# Patient Record
Sex: Female | Born: 1975 | Race: White | Hispanic: No | Marital: Married | Smoking: Current every day smoker
Health system: Southern US, Community
[De-identification: ages and names within clinical notes are randomized; demographics above are authoritative.]

## PROBLEM LIST (undated history)

## (undated) DIAGNOSIS — F419 Anxiety disorder, unspecified: Secondary | ICD-10-CM

## (undated) DIAGNOSIS — F4310 Post-traumatic stress disorder, unspecified: Secondary | ICD-10-CM

## (undated) DIAGNOSIS — M4317 Spondylolisthesis, lumbosacral region: Secondary | ICD-10-CM

## (undated) DIAGNOSIS — M79606 Pain in leg, unspecified: Secondary | ICD-10-CM

## (undated) DIAGNOSIS — T8859XA Other complications of anesthesia, initial encounter: Secondary | ICD-10-CM

## (undated) DIAGNOSIS — M545 Low back pain, unspecified: Secondary | ICD-10-CM

## (undated) DIAGNOSIS — R519 Headache, unspecified: Secondary | ICD-10-CM

## (undated) DIAGNOSIS — Z9889 Other specified postprocedural states: Secondary | ICD-10-CM

## (undated) DIAGNOSIS — K219 Gastro-esophageal reflux disease without esophagitis: Secondary | ICD-10-CM

## (undated) DIAGNOSIS — R112 Nausea with vomiting, unspecified: Secondary | ICD-10-CM

## (undated) HISTORY — DX: Low back pain, unspecified: M54.50

## (undated) HISTORY — PX: TONSILLECTOMY: SUR1361

## (undated) HISTORY — PX: BREAST LUMPECTOMY: SHX2

## (undated) HISTORY — DX: Spondylolisthesis, lumbosacral region: M43.17

## (undated) HISTORY — PX: OTHER SURGICAL HISTORY: SHX169

## (undated) HISTORY — DX: Pain in leg, unspecified: M79.606

---

## 2006-08-03 DIAGNOSIS — I471 Supraventricular tachycardia, unspecified: Secondary | ICD-10-CM

## 2006-08-03 HISTORY — DX: Supraventricular tachycardia: I47.1

## 2006-08-03 HISTORY — DX: Supraventricular tachycardia, unspecified: I47.10

## 2020-04-26 ENCOUNTER — Other Ambulatory Visit: Payer: Self-pay | Admitting: Orthopedic Surgery

## 2020-05-01 ENCOUNTER — Other Ambulatory Visit: Payer: Self-pay

## 2020-05-13 ENCOUNTER — Other Ambulatory Visit: Payer: Self-pay

## 2020-05-13 ENCOUNTER — Encounter: Payer: Self-pay | Admitting: Surgery

## 2020-05-13 ENCOUNTER — Ambulatory Visit (INDEPENDENT_AMBULATORY_CARE_PROVIDER_SITE_OTHER): Payer: No Typology Code available for payment source | Admitting: Surgery

## 2020-05-13 VITALS — BP 137/85 | HR 92 | Temp 98.2°F | Resp 20 | Ht 62.0 in | Wt 191.0 lb

## 2020-05-13 DIAGNOSIS — M479 Spondylosis, unspecified: Secondary | ICD-10-CM

## 2020-05-13 NOTE — H&P (View-Only) (Signed)
Vascular and Vein Specialist of [facility]  Patient name: Heather Short MRN: [id number] DOB: [birth year] Sex: female   REQUESTING PROVIDER:    Dr. Yevette Edwards   REASON FOR CONSULT:    Low back disease  HISTORY OF PRESENT ILLNESS:   Heather Short is a 44 y.o. female, who is referred for anterior exposure of L5-S1.  The patient suffered a traumatic injury at work.  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  The patient's prior operations include transvaginal tubal ligation and C-section.  She is a smoker  PAST MEDICAL HISTORY    Past Medical History:  Diagnosis Date  . Leg pain    left  . Low back pain   . Spondylolisthesis at L5-S1 level      FAMILY HISTORY   History reviewed. No pertinent family history.  SOCIAL HISTORY:   Social History   Socioeconomic History  . Marital status: Married    Spouse name: Not on file  . Number of children: Not on file  . Years of education: Not on file  . Highest education level: Not on file  Occupational History  . Not on file  Tobacco Use  . Smoking status: Current Every Day Smoker    Packs/day: 0.25    Types: Cigarettes  . Smokeless tobacco: Never Used  Vaping Use  . Vaping Use: Never used  Substance and Sexual Activity  . Alcohol use: Yes    Comment: occasional   . Drug use: Never  . Sexual activity: Not on file  Other Topics Concern  . Not on file  Social History Narrative  . Not on file   Social Determinants of Health   Financial Resource Strain:   . Difficulty of Paying Living Expenses: Not on file  Food Insecurity:   . Worried About Programme researcher, broadcasting/film/video in the Last Year: Not on file  . Ran Out of Food in the Last Year: Not on file  Transportation Needs:   . Lack of Transportation (Medical): Not on file  . Lack of Transportation (Non-Medical): Not on file  Physical Activity:   . Days of Exercise per Week: Not  on file  . Minutes of Exercise per Session: Not on file  Stress:   . Feeling of Stress : Not on file  Social Connections:   . Frequency of Communication with Friends and Family: Not on file  . Frequency of Social Gatherings with Friends and Family: Not on file  . Attends Religious Services: Not on file  . Active Member of Clubs or Organizations: Not on file  . Attends Banker Meetings: Not on file  . Marital Status: Not on file  Intimate Partner Violence:   . Fear of Current or Ex-Partner: Not on file  . Emotionally Abused: Not on file  . Physically Abused: Not on file  . Sexually Abused: Not on file    ALLERGIES:    Allergies  Allergen Reactions  . Eletriptan     Other reaction(s): Other Patient reports her throat swells when she takes relpax  . Topiramate     Other reaction(s): Confusion  . Latex Hives  . Midazolam Hives  . Other Hives    CURRENT MEDICATIONS:    Current Outpatient Medications  Medication Sig Dispense Refill  . amphetamine-dextroamphetamine (ADDERALL) 10 MG tablet Take 10 mg by mouth daily.    Marland Kitchen buPROPion (WELLBUTRIN XL) 300 MG 24 hr  tablet Take 300 mg by mouth every morning.    . cyclobenzaprine (FLEXERIL) 5 MG tablet Take 5-10 mg by mouth at bedtime as needed.    . gabapentin (NEURONTIN) 300 MG capsule Take 300 mg by mouth 3 (three) times daily as needed.    . hydrOXYzine (VISTARIL) 25 MG capsule Take by mouth.    Marland Kitchen imipramine (TOFRANIL) 10 MG tablet Take 30 mg by mouth at bedtime.    . metoCLOPramide (REGLAN) 10 MG tablet Take by mouth.    Marland Kitchen omeprazole (PRILOSEC) 40 MG capsule Take 40 mg by mouth daily.    . ondansetron (ZOFRAN) 4 MG tablet Take by mouth.    Marland Kitchen tiZANidine (ZANAFLEX) 4 MG capsule TAKE ONE CAPSULE BY MOUTH EVERY 8 HOURS    . traZODone (DESYREL) 100 MG tablet Take 100 mg by mouth at bedtime.     No current facility-administered medications for this visit.    REVIEW OF SYSTEMS:   [X]  denotes positive finding, [ ]   denotes negative finding Cardiac  Comments:  Chest pain or chest pressure:    Shortness of breath upon exertion:    Short of breath when lying flat:    Irregular heart rhythm:        Vascular    Pain in calf, thigh, or hip brought on by ambulation:    Pain in feet at night that wakes you up from your sleep:     Blood clot in your veins:    Leg swelling:         Pulmonary    Oxygen at home:    Productive cough:     Wheezing:         Neurologic    Sudden weakness in arms or legs:     Sudden numbness in arms or legs:     Sudden onset of difficulty speaking or slurred speech:    Temporary loss of vision in one eye:     Problems with dizziness:         Gastrointestinal    Blood in stool:      Vomited blood:         Genitourinary    Burning when urinating:     Blood in urine:        Psychiatric    Major depression:         Hematologic    Bleeding problems:    Problems with blood clotting too easily:        Skin    Rashes or ulcers:        Constitutional    Fever or chills:     PHYSICAL EXAM:   Vitals:   05/13/20 1455  BP: 137/85  Pulse: 92  Resp: 20  Temp: 98.2 F (36.8 C)  SpO2: 97%  Weight: 191 lb (86.6 kg)  Height: 5\' 2"  (1.575 m)    GENERAL: The patient is a well-nourished female, in no acute distress. The vital signs are documented above. CARDIAC: There is a regular rate and rhythm.  VASCULAR: Palpable pedal pulses bilaterally PULMONARY: Nonlabored respirations ABDOMEN: Soft and non-tender with normal pitched bowel sounds.  MUSCULOSKELETAL: There are no major deformities or cyanosis. NEUROLOGIC: No focal weakness or paresthesias are detected. SKIN: There are no ulcers or rashes noted. PSYCHIATRIC: The patient has a normal affect.  STUDIES:   I have reviewed her MRI with normal vascular findings  ASSESSMENT and PLAN   Low back pain: I have been asked to provide anterior exposure for the  L5-S1 disc space.  I discussed my role in the procedure  including the type of incision, the risk of injury to the artery nerve and ureter.  The risk of hernia and wound issues.  All the patient's questions were answered.   Charlena Cross, MD, FACS [facility] Specialists of [facility] [phone] Pager [phone]

## 2020-05-13 NOTE — Progress Notes (Signed)
Vascular and Vein Specialist of [facility]  Patient name: Heather Short MRN: [id number] DOB: [birth year] Sex: female   REQUESTING PROVIDER:    Dr. Yevette Edwards   REASON FOR CONSULT:    Low back disease  HISTORY OF PRESENT ILLNESS:   Heather Short is a 44 y.o. female, who is referred for anterior exposure of L5-S1.  The patient suffered a traumatic injury at work.  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  The patient's prior operations include transvaginal tubal ligation and C-section.  She is a smoker  PAST MEDICAL HISTORY    Past Medical History:  Diagnosis Date  . Leg pain    left  . Low back pain   . Spondylolisthesis at L5-S1 level      FAMILY HISTORY   History reviewed. No pertinent family history.  SOCIAL HISTORY:   Social History   Socioeconomic History  . Marital status: Married    Spouse name: Not on file  . Number of children: Not on file  . Years of education: Not on file  . Highest education level: Not on file  Occupational History  . Not on file  Tobacco Use  . Smoking status: Current Every Day Smoker    Packs/day: 0.25    Types: Cigarettes  . Smokeless tobacco: Never Used  Vaping Use  . Vaping Use: Never used  Substance and Sexual Activity  . Alcohol use: Yes    Comment: occasional   . Drug use: Never  . Sexual activity: Not on file  Other Topics Concern  . Not on file  Social History Narrative  . Not on file   Social Determinants of Health   Financial Resource Strain:   . Difficulty of Paying Living Expenses: Not on file  Food Insecurity:   . Worried About Programme researcher, broadcasting/film/video in the Last Year: Not on file  . Ran Out of Food in the Last Year: Not on file  Transportation Needs:   . Lack of Transportation (Medical): Not on file  . Lack of Transportation (Non-Medical): Not on file  Physical Activity:   . Days of Exercise per Week: Not  on file  . Minutes of Exercise per Session: Not on file  Stress:   . Feeling of Stress : Not on file  Social Connections:   . Frequency of Communication with Friends and Family: Not on file  . Frequency of Social Gatherings with Friends and Family: Not on file  . Attends Religious Services: Not on file  . Active Member of Clubs or Organizations: Not on file  . Attends Banker Meetings: Not on file  . Marital Status: Not on file  Intimate Partner Violence:   . Fear of Current or Ex-Partner: Not on file  . Emotionally Abused: Not on file  . Physically Abused: Not on file  . Sexually Abused: Not on file    ALLERGIES:    Allergies  Allergen Reactions  . Eletriptan     Other reaction(s): Other Patient reports her throat swells when she takes relpax  . Topiramate     Other reaction(s): Confusion  . Latex Hives  . Midazolam Hives  . Other Hives    CURRENT MEDICATIONS:    Current Outpatient Medications  Medication Sig Dispense Refill  . amphetamine-dextroamphetamine (ADDERALL) 10 MG tablet Take 10 mg by mouth daily.    Marland Kitchen buPROPion (WELLBUTRIN XL) 300 MG 24 hr  tablet Take 300 mg by mouth every morning.    . cyclobenzaprine (FLEXERIL) 5 MG tablet Take 5-10 mg by mouth at bedtime as needed.    . gabapentin (NEURONTIN) 300 MG capsule Take 300 mg by mouth 3 (three) times daily as needed.    . hydrOXYzine (VISTARIL) 25 MG capsule Take by mouth.    Marland Kitchen imipramine (TOFRANIL) 10 MG tablet Take 30 mg by mouth at bedtime.    . metoCLOPramide (REGLAN) 10 MG tablet Take by mouth.    Marland Kitchen omeprazole (PRILOSEC) 40 MG capsule Take 40 mg by mouth daily.    . ondansetron (ZOFRAN) 4 MG tablet Take by mouth.    Marland Kitchen tiZANidine (ZANAFLEX) 4 MG capsule TAKE ONE CAPSULE BY MOUTH EVERY 8 HOURS    . traZODone (DESYREL) 100 MG tablet Take 100 mg by mouth at bedtime.     No current facility-administered medications for this visit.    REVIEW OF SYSTEMS:   [X]  denotes positive finding, [ ]   denotes negative finding Cardiac  Comments:  Chest pain or chest pressure:    Shortness of breath upon exertion:    Short of breath when lying flat:    Irregular heart rhythm:        Vascular    Pain in calf, thigh, or hip brought on by ambulation:    Pain in feet at night that wakes you up from your sleep:     Blood clot in your veins:    Leg swelling:         Pulmonary    Oxygen at home:    Productive cough:     Wheezing:         Neurologic    Sudden weakness in arms or legs:     Sudden numbness in arms or legs:     Sudden onset of difficulty speaking or slurred speech:    Temporary loss of vision in one eye:     Problems with dizziness:         Gastrointestinal    Blood in stool:      Vomited blood:         Genitourinary    Burning when urinating:     Blood in urine:        Psychiatric    Major depression:         Hematologic    Bleeding problems:    Problems with blood clotting too easily:        Skin    Rashes or ulcers:        Constitutional    Fever or chills:     PHYSICAL EXAM:   Vitals:   05/13/20 1455  BP: 137/85  Pulse: 92  Resp: 20  Temp: 98.2 F (36.8 C)  SpO2: 97%  Weight: 191 lb (86.6 kg)  Height: 5\' 2"  (1.575 m)    GENERAL: The patient is a well-nourished female, in no acute distress. The vital signs are documented above. CARDIAC: There is a regular rate and rhythm.  VASCULAR: Palpable pedal pulses bilaterally PULMONARY: Nonlabored respirations ABDOMEN: Soft and non-tender with normal pitched bowel sounds.  MUSCULOSKELETAL: There are no major deformities or cyanosis. NEUROLOGIC: No focal weakness or paresthesias are detected. SKIN: There are no ulcers or rashes noted. PSYCHIATRIC: The patient has a normal affect.  STUDIES:   I have reviewed her MRI with normal vascular findings  ASSESSMENT and PLAN   Low back pain: I have been asked to provide anterior exposure for the  L5-S1 disc space.  I discussed my role in the procedure  including the type of incision, the risk of injury to the artery nerve and ureter.  The risk of hernia and wound issues.  All the patient's questions were answered.   Charlena Cross, MD, FACS [facility] [phone] Pager [phone]

## 2020-05-27 ENCOUNTER — Encounter (HOSPITAL_COMMUNITY)
Admission: RE | Admit: 2020-05-27 | Discharge: 2020-05-27 | Disposition: A | Discharge status: Home / Self Care | Payer: No Typology Code available for payment source | Source: Ambulatory Visit | Attending: Orthopedic Surgery | Admitting: Orthopedic Surgery

## 2020-05-27 ENCOUNTER — Other Ambulatory Visit: Payer: Self-pay

## 2020-05-27 ENCOUNTER — Encounter (HOSPITAL_COMMUNITY): Payer: Self-pay

## 2020-05-27 ENCOUNTER — Other Ambulatory Visit (HOSPITAL_COMMUNITY)
Admission: RE | Admit: 2020-05-27 | Discharge: 2020-05-27 | Disposition: A | Discharge status: Home / Self Care | Payer: Self-pay | Source: Ambulatory Visit | Attending: Orthopedic Surgery | Admitting: Orthopedic Surgery

## 2020-05-27 DIAGNOSIS — Z20822 Contact with and (suspected) exposure to covid-19: Secondary | ICD-10-CM | POA: Insufficient documentation

## 2020-05-27 DIAGNOSIS — Z01812 Encounter for preprocedural laboratory examination: Secondary | ICD-10-CM | POA: Insufficient documentation

## 2020-05-27 HISTORY — DX: Other specified postprocedural states: Z98.890

## 2020-05-27 HISTORY — DX: Other complications of anesthesia, initial encounter: T88.59XA

## 2020-05-27 HISTORY — DX: Post-traumatic stress disorder, unspecified: F43.10

## 2020-05-27 HISTORY — DX: Anxiety disorder, unspecified: F41.9

## 2020-05-27 HISTORY — DX: Other specified postprocedural states: R11.2

## 2020-05-27 HISTORY — DX: Headache, unspecified: R51.9

## 2020-05-27 HISTORY — DX: Gastro-esophageal reflux disease without esophagitis: K21.9

## 2020-05-27 LAB — CBC WITH DIFFERENTIAL/PLATELET
Abs Immature Granulocytes: 0.1 10*3/uL — ABNORMAL HIGH (ref 0.00–0.07)
Basophils Absolute: 0.1 10*3/uL (ref 0.0–0.1)
Basophils Relative: 1 %
Eosinophils Absolute: 0.2 10*3/uL (ref 0.0–0.5)
Eosinophils Relative: 2 %
HCT: 42.3 % (ref 36.0–46.0)
Hemoglobin: 13.7 g/dL (ref 12.0–15.0)
Immature Granulocytes: 1 %
Lymphocytes Relative: 37 %
Lymphs Abs: 3.7 10*3/uL (ref 0.7–4.0)
MCH: 29.5 pg (ref 26.0–34.0)
MCHC: 32.4 g/dL (ref 30.0–36.0)
MCV: 91.2 fL (ref 80.0–100.0)
Monocytes Absolute: 0.5 10*3/uL (ref 0.1–1.0)
Monocytes Relative: 5 %
Neutro Abs: 5.3 10*3/uL (ref 1.7–7.7)
Neutrophils Relative %: 54 %
Platelets: 348 10*3/uL (ref 150–400)
RBC: 4.64 MIL/uL (ref 3.87–5.11)
RDW: 12.5 % (ref 11.5–15.5)
WBC: 9.9 10*3/uL (ref 4.0–10.5)
nRBC: 0 % (ref 0.0–0.2)

## 2020-05-27 LAB — PROTIME-INR
INR: 1 (ref 0.8–1.2)
Prothrombin Time: 12.5 seconds (ref 11.4–15.2)

## 2020-05-27 LAB — COMPREHENSIVE METABOLIC PANEL WITH GFR
ALT: 23 U/L (ref 0–44)
AST: 19 U/L (ref 15–41)
Albumin: 3.7 g/dL (ref 3.5–5.0)
Alkaline Phosphatase: 58 U/L (ref 38–126)
Anion gap: 8 (ref 5–15)
BUN: 12 mg/dL (ref 6–20)
CO2: 22 mmol/L (ref 22–32)
Calcium: 9.2 mg/dL (ref 8.9–10.3)
Chloride: 107 mmol/L (ref 98–111)
Creatinine, Ser: 0.84 mg/dL (ref 0.44–1.00)
GFR, Estimated: >60 mL/min
Glucose, Bld: 94 mg/dL (ref 70–99)
Potassium: 4.4 mmol/L (ref 3.5–5.1)
Sodium: 137 mmol/L (ref 135–145)
Total Bilirubin: 0.6 mg/dL (ref 0.3–1.2)
Total Protein: 6.8 g/dL (ref 6.5–8.1)

## 2020-05-27 LAB — SURGICAL PCR SCREEN
MRSA, PCR: NEGATIVE
Staphylococcus aureus: POSITIVE — AB

## 2020-05-27 LAB — URINALYSIS, ROUTINE W REFLEX MICROSCOPIC
Bilirubin Urine: NEGATIVE
Glucose, UA: NEGATIVE mg/dL
Hgb urine dipstick: NEGATIVE
Ketones, ur: NEGATIVE mg/dL
Leukocytes,Ua: NEGATIVE
Nitrite: NEGATIVE
Protein, ur: NEGATIVE mg/dL
Specific Gravity, Urine: 1.005 (ref 1.005–1.030)
pH: 5 (ref 5.0–8.0)

## 2020-05-27 LAB — APTT: aPTT: 27 s (ref 24–36)

## 2020-05-27 LAB — TYPE AND SCREEN
ABO/RH(D): A NEG
Antibody Screen: NEGATIVE

## 2020-05-27 LAB — SARS CORONAVIRUS 2 (TAT 6-24 HRS): SARS Coronavirus 2: NEGATIVE

## 2020-05-27 NOTE — Pre-Procedure Instructions (Addendum)
Heather Short  05/27/2020     Your procedure is scheduled on Wednesday, October 27.  Report to [facility], Main Entrance or Entrance "A" at 5:30 AM                Your surgery or procedure is scheduled to begin at 8:30 AM   Call this number if you have problems the morning of surgery: [phone]  This is the number for the Pre- Surgical Desk.                For any other questions, please call [phone], Monday - Friday 8 AM - 4 PM.   Remember:  Do not eat after midnight Tuesday, October 26. You Make drink Clear lIquids 5:30 AM Water, Ginger Ale, Gatorade. Drink the Pre Surgery Ensure between 4:30am  and 5 :30 am  Take these medicines the morning of surgery with A SIP OF WATER: buPROPion (WELLBUTRIN XL     omeprazole (PRILOSEC)  Take if needed: tiZANidine (ZANAFLEX)     frovatriptan (FROVA)     hydrOXYzine (VISTARIL)     ondansetron (ZOFRAN)  1 Week prior to surgery STOP taking Aspirin, Aspirin Products (Goody Powder, Excedrin Migraine), Ibuprofen (Advil), Naproxen (Aleve), Vitamins and Herbal Products (ie Fish Oil).    Special instructions:  DO NOT Smoke within 24 hours of surgery.  [place]- Preparing For Surgery  Before surgery, you can play an important role. Because skin is not sterile, your skin needs to be as free of germs as possible. You can reduce the number of germs on your skin by washing with CHG (chlorahexidine gluconate) Soap before surgery.  CHG is an antiseptic cleaner which kills germs and bonds with the skin to continue killing germs even after washing.    Oral Hygiene is also important to reduce your risk of infection.  Remember - BRUSH YOUR TEETH THE MORNING OF SURGERY WITH YOUR REGULAR TOOTHPASTE  Please do not use if you have an allergy to CHG or antibacterial soaps. If your skin becomes reddened/irritated stop using the CHG.  Do not shave (including legs and underarms) for at least 48 hours prior to first CHG shower. It is OK to  shave your face.  Please follow these instructions carefully.   1. Shower the NIGHT BEFORE SURGERY and the MORNING OF SURGERY with CHG.   2. If you chose to wash your hair, wash your hair first as usual with your normal shampoo.  3. After you shampoo, wash your face and private area with the soap you use at home, then rinse your hair and body thoroughly to remove the shampoo and soap.  4. Use CHG as you would any other liquid soap. You can apply CHG directly to the skin and wash gently with a scrungie or a clean washcloth.   5. Apply the CHG Soap to your body ONLY FROM THE NECK DOWN.  Do not use on open wounds or open sores. Avoid contact with your eyes, ears, mouth and genitals (private parts).   6. Wash thoroughly, paying special attention to the area where your surgery will be performed.  7. Thoroughly rinse your body with warm water from the neck down.  8. DO NOT shower/wash with your normal soap after using and rinsing off the CHG Soap.  9. Pat yourself dry with a CLEAN TOWEL.  10. Wear CLEAN PAJAMAS to bed the night before surgery, wear comfortable clothes the morning of surgery  11. Place CLEAN SHEETS on  your bed the night of your first shower and DO NOT SLEEP WITH PETS.  Day of Surgery: Shower as instructed above. Do not apply any deodorants/lotions, powders or colognes.  Please wear clean clothes to the hospital/surgery center.   Remember to brush your teeth WITH YOUR REGULAR TOOTHPASTE.  Do not wear jewelry, make-up or nail polish.  Do not shave 48 hours prior to surgery.  Men may shave face and neck.  Do not bring valuables to the hospital.  [facility] is not responsible for any belongings or valuables.  Contacts, dentures or bridgework may not be worn into surgery.  Leave your suitcase in the car.  After surgery it may be brought to your room.  For patients admitted to the hospital, discharge time will be determined by your treatment team.  Patients discharged the  day of surgery will not be allowed to drive home.   Please read over the fact sheets that you were given.

## 2020-05-27 NOTE — Progress Notes (Addendum)
PCP - Carron Curie- PA-C  Cardiologist - no  Chest x-ray - na  EKG - 05/27/20  Stress Test - no  ECHO - no  Cardiac Cath - no  Sleep Study - no CPAP - no  LABS-CBC with diff, PT, PTT UA, T/S, PCR  ASA-no  ERAS-yes- clears until 0530- 1 bottle of Pre- SUrgerry Ensure  HA1C-na  Fasting Blood Sugar - naChecks Blood Sugar __0___ times a day   Anesthesia-  Mrs. Sweitzer denies history of High blood pressure- BP at 0807- 156/97 . BP at 0905 was 144/99. EKG done due to HTN.  PCP is at [facility]. I review office visits for > 1 year the highest blood pressure I saw was 142/96- 02/14/2019 Mrs. Kerchner will have her husband to check blood pressure at home. Pt denies having chest pain, sob, or fever at this time. All instructions explained to the pt, with a verbal understanding of the material. Pt agrees to go over the instructions while at home for a better understanding. Pt also instructed to self quarantine after being tested for COVID-19. The opportunity to ask questions was provided.  Anesthesiology PA-C to review.

## 2020-05-28 NOTE — Anesthesia Preprocedure Evaluation (Addendum)
Anesthesia Evaluation  Patient identified by MRN, date of birth, ID band Patient awake    Reviewed: Allergy & Precautions, NPO status , Patient's Chart, lab work & pertinent test results  History of Anesthesia Complications (+) PONV and history of anesthetic complications  Airway Mallampati: II  TM Distance: >3 FB Neck ROM: Full    Dental  (+) Dental Advisory Given, Teeth Intact   Pulmonary neg pulmonary ROS, Current Smoker and Patient abstained from smoking.,    Pulmonary exam normal breath sounds clear to auscultation       Cardiovascular negative cardio ROS Normal cardiovascular exam Rhythm:Regular Rate:Normal     Neuro/Psych  Headaches, PSYCHIATRIC DISORDERS Anxiety    GI/Hepatic Neg liver ROS, GERD  ,  Endo/Other  negative endocrine ROS  Renal/GU negative Renal ROS     Musculoskeletal negative musculoskeletal ROS (+)   Abdominal (+) + obese,   Peds  Hematology negative hematology ROS (+)   Anesthesia Other Findings   Reproductive/Obstetrics                            Anesthesia Physical Anesthesia Plan  ASA: III  Anesthesia Plan: General   Post-op Pain Management:    Induction: Intravenous  PONV Risk Score and Plan: 4 or greater and Ondansetron, Dexamethasone, Treatment may vary due to age or medical condition, Midazolam and Scopolamine patch - Pre-op  Airway Management Planned: Oral ETT  Additional Equipment: Arterial line  Intra-op Plan:   Post-operative Plan: Extubation in OR  Informed Consent: I have reviewed the patients History and Physical, chart, labs and discussed the procedure including the risks, benefits and alternatives for the proposed anesthesia with the patient or authorized representative who has indicated his/her understanding and acceptance.     Dental advisory given  Plan Discussed with: CRNA  Anesthesia Plan Comments: (2 x PIV, sufenta  infusion)      Anesthesia Quick Evaluation

## 2020-05-29 ENCOUNTER — Inpatient Hospital Stay (HOSPITAL_COMMUNITY): Payer: No Typology Code available for payment source

## 2020-05-29 ENCOUNTER — Inpatient Hospital Stay (HOSPITAL_COMMUNITY): Payer: No Typology Code available for payment source | Admitting: Physician Assistant

## 2020-05-29 ENCOUNTER — Inpatient Hospital Stay (HOSPITAL_COMMUNITY)
Admission: RE | Admit: 2020-05-29 | Discharge: 2020-05-30 | DRG: 455 | Disposition: A | Discharge status: Home / Self Care | Payer: No Typology Code available for payment source | Attending: Orthopedic Surgery | Admitting: Orthopedic Surgery

## 2020-05-29 ENCOUNTER — Inpatient Hospital Stay (HOSPITAL_COMMUNITY): Payer: No Typology Code available for payment source | Admitting: Certified Registered Nurse Anesthetist

## 2020-05-29 ENCOUNTER — Encounter (HOSPITAL_COMMUNITY): Payer: Self-pay | Admitting: Orthopedic Surgery

## 2020-05-29 ENCOUNTER — Encounter (HOSPITAL_COMMUNITY)
Admission: RE | Disposition: A | Discharge status: Home / Self Care | Payer: Self-pay | Source: Home / Self Care | Attending: Orthopedic Surgery

## 2020-05-29 DIAGNOSIS — F419 Anxiety disorder, unspecified: Secondary | ICD-10-CM | POA: Diagnosis present

## 2020-05-29 DIAGNOSIS — F4310 Post-traumatic stress disorder, unspecified: Secondary | ICD-10-CM | POA: Diagnosis present

## 2020-05-29 DIAGNOSIS — M5416 Radiculopathy, lumbar region: Secondary | ICD-10-CM | POA: Diagnosis present

## 2020-05-29 DIAGNOSIS — K219 Gastro-esophageal reflux disease without esophagitis: Secondary | ICD-10-CM | POA: Diagnosis present

## 2020-05-29 DIAGNOSIS — Z91040 Latex allergy status: Secondary | ICD-10-CM

## 2020-05-29 DIAGNOSIS — Z79899 Other long term (current) drug therapy: Secondary | ICD-10-CM

## 2020-05-29 DIAGNOSIS — F17210 Nicotine dependence, cigarettes, uncomplicated: Secondary | ICD-10-CM | POA: Diagnosis present

## 2020-05-29 DIAGNOSIS — Z888 Allergy status to other drugs, medicaments and biological substances status: Secondary | ICD-10-CM | POA: Diagnosis not present

## 2020-05-29 DIAGNOSIS — Z419 Encounter for procedure for purposes other than remedying health state, unspecified: Secondary | ICD-10-CM

## 2020-05-29 DIAGNOSIS — M5410 Radiculopathy, site unspecified: Secondary | ICD-10-CM | POA: Diagnosis present

## 2020-05-29 HISTORY — PX: ABDOMINAL EXPOSURE: SHX5708

## 2020-05-29 HISTORY — PX: ANTERIOR LAT LUMBAR FUSION: SHX1168

## 2020-05-29 LAB — ABO/RH: ABO/RH(D): A NEG

## 2020-05-29 LAB — POCT PREGNANCY, URINE: Preg Test, Ur: NEGATIVE

## 2020-05-29 SURGERY — ANTERIOR LATERAL LUMBAR FUSION 1 LEVEL
Anesthesia: General | Site: Spine Lumbar

## 2020-05-29 MED ORDER — PHENYLEPHRINE 40 MCG/ML (10ML) SYRINGE FOR IV PUSH (FOR BLOOD PRESSURE SUPPORT)
PREFILLED_SYRINGE | INTRAVENOUS | Status: AC
  Filled 2020-05-29: qty 10

## 2020-05-29 MED ORDER — CHLORHEXIDINE GLUCONATE 0.12 % MT SOLN
15.0000 mL | Freq: Once | OROMUCOSAL | Status: AC
  Administered 2020-05-29: 15 mL via OROMUCOSAL
  Filled 2020-05-29: qty 15

## 2020-05-29 MED ORDER — PROPOFOL 500 MG/50ML IV EMUL
INTRAVENOUS | Status: DC | PRN
  Administered 2020-05-29: 75 ug/kg/min via INTRAVENOUS

## 2020-05-29 MED ORDER — ONDANSETRON HCL 4 MG PO TABS: 4.0000 mg | ORAL_TABLET | Freq: Four times a day (QID) | ORAL | Status: DC | PRN

## 2020-05-29 MED ORDER — PHENYLEPHRINE HCL-NACL 10-0.9 MG/250ML-% IV SOLN
INTRAVENOUS | Status: DC | PRN
  Administered 2020-05-29: 40 ug/min via INTRAVENOUS

## 2020-05-29 MED ORDER — MORPHINE SULFATE (PF) 2 MG/ML IV SOLN
1.0000 mg | INTRAVENOUS | Status: DC | PRN
  Administered 2020-05-29 – 2020-05-30 (×3): 2 mg via INTRAVENOUS
  Filled 2020-05-29 (×3): qty 1

## 2020-05-29 MED ORDER — CHLORHEXIDINE GLUCONATE 4 % EX LIQD: 60.0000 mL | Freq: Once | CUTANEOUS | Status: DC

## 2020-05-29 MED ORDER — ACETAMINOPHEN 10 MG/ML IV SOLN
INTRAVENOUS | Status: AC
  Filled 2020-05-29: qty 100

## 2020-05-29 MED ORDER — BUPIVACAINE LIPOSOME 1.3 % IJ SUSP
INTRAMUSCULAR | Status: DC | PRN
  Administered 2020-05-29: 20 mL

## 2020-05-29 MED ORDER — LIDOCAINE 2% (20 MG/ML) 5 ML SYRINGE
INTRAMUSCULAR | Status: AC
  Filled 2020-05-29: qty 5

## 2020-05-29 MED ORDER — DOCUSATE SODIUM 100 MG PO CAPS
100.0000 mg | ORAL_CAPSULE | Freq: Two times a day (BID) | ORAL | Status: DC
  Administered 2020-05-29 – 2020-05-30 (×2): 100 mg via ORAL
  Filled 2020-05-29 (×2): qty 1

## 2020-05-29 MED ORDER — BISACODYL 5 MG PO TBEC: 5.0000 mg | DELAYED_RELEASE_TABLET | Freq: Every day | ORAL | Status: DC | PRN

## 2020-05-29 MED ORDER — SUGAMMADEX SODIUM 200 MG/2ML IV SOLN
INTRAVENOUS | Status: DC | PRN
  Administered 2020-05-29: 200 mg via INTRAVENOUS

## 2020-05-29 MED ORDER — CYCLOBENZAPRINE HCL 10 MG PO TABS
10.0000 mg | ORAL_TABLET | Freq: Every day | ORAL | Status: DC
  Administered 2020-05-29: 10 mg via ORAL
  Filled 2020-05-29: qty 1

## 2020-05-29 MED ORDER — SCOPOLAMINE 1 MG/3DAYS TD PT72
MEDICATED_PATCH | TRANSDERMAL | Status: AC
  Administered 2020-05-29: 1.5 mg via TRANSDERMAL
  Filled 2020-05-29: qty 1

## 2020-05-29 MED ORDER — FENTANYL CITRATE (PF) 250 MCG/5ML IJ SOLN
INTRAMUSCULAR | Status: AC
  Filled 2020-05-29: qty 5

## 2020-05-29 MED ORDER — ZOLPIDEM TARTRATE 5 MG PO TABS: 5.0000 mg | ORAL_TABLET | Freq: Every evening | ORAL | Status: DC | PRN

## 2020-05-29 MED ORDER — ACETAMINOPHEN 10 MG/ML IV SOLN
INTRAVENOUS | Status: DC | PRN
  Administered 2020-05-29: 1000 mg via INTRAVENOUS

## 2020-05-29 MED ORDER — DEXAMETHASONE SODIUM PHOSPHATE 10 MG/ML IJ SOLN
INTRAMUSCULAR | Status: AC
  Filled 2020-05-29: qty 1

## 2020-05-29 MED ORDER — ONDANSETRON HCL 4 MG/2ML IJ SOLN: 4.0000 mg | Freq: Four times a day (QID) | INTRAMUSCULAR | Status: DC | PRN

## 2020-05-29 MED ORDER — BUPROPION HCL ER (XL) 300 MG PO TB24
300.0000 mg | ORAL_TABLET | Freq: Every morning | ORAL | Status: DC
  Administered 2020-05-30: 300 mg via ORAL
  Filled 2020-05-29: qty 1

## 2020-05-29 MED ORDER — DEXAMETHASONE SODIUM PHOSPHATE 10 MG/ML IJ SOLN
INTRAMUSCULAR | Status: DC | PRN
  Administered 2020-05-29: 10 mg via INTRAVENOUS

## 2020-05-29 MED ORDER — PHENYLEPHRINE 40 MCG/ML (10ML) SYRINGE FOR IV PUSH (FOR BLOOD PRESSURE SUPPORT)
PREFILLED_SYRINGE | INTRAVENOUS | Status: DC | PRN
  Administered 2020-05-29 (×4): 80 ug via INTRAVENOUS

## 2020-05-29 MED ORDER — CEFAZOLIN SODIUM-DEXTROSE 2-4 GM/100ML-% IV SOLN
2.0000 g | Freq: Three times a day (TID) | INTRAVENOUS | Status: AC
  Administered 2020-05-29 – 2020-05-30 (×2): 2 g via INTRAVENOUS
  Filled 2020-05-29 (×2): qty 100

## 2020-05-29 MED ORDER — TRAZODONE HCL 100 MG PO TABS
100.0000 mg | ORAL_TABLET | Freq: Every day | ORAL | Status: DC
  Administered 2020-05-29: 100 mg via ORAL
  Filled 2020-05-29 (×2): qty 1

## 2020-05-29 MED ORDER — PROPOFOL 10 MG/ML IV BOLUS
INTRAVENOUS | Status: DC | PRN
  Administered 2020-05-29: 40 mg via INTRAVENOUS
  Administered 2020-05-29: 200 mg via INTRAVENOUS

## 2020-05-29 MED ORDER — PROPOFOL 10 MG/ML IV BOLUS
INTRAVENOUS | Status: AC
  Filled 2020-05-29: qty 40

## 2020-05-29 MED ORDER — METHOCARBAMOL 500 MG PO TABS
500.0000 mg | ORAL_TABLET | Freq: Four times a day (QID) | ORAL | Status: DC | PRN
  Administered 2020-05-29 – 2020-05-30 (×3): 500 mg via ORAL
  Filled 2020-05-29 (×3): qty 1

## 2020-05-29 MED ORDER — FENTANYL CITRATE (PF) 250 MCG/5ML IJ SOLN
INTRAMUSCULAR | Status: DC | PRN
  Administered 2020-05-29 (×3): 50 ug via INTRAVENOUS

## 2020-05-29 MED ORDER — ROCURONIUM BROMIDE 100 MG/10ML IV SOLN
INTRAVENOUS | Status: DC | PRN
  Administered 2020-05-29 (×2): 10 mg via INTRAVENOUS
  Administered 2020-05-29: 50 mg via INTRAVENOUS
  Administered 2020-05-29: 20 mg via INTRAVENOUS
  Administered 2020-05-29: 10 mg via INTRAVENOUS

## 2020-05-29 MED ORDER — PROMETHAZINE HCL 25 MG/ML IJ SOLN
INTRAMUSCULAR | Status: AC
  Filled 2020-05-29: qty 1

## 2020-05-29 MED ORDER — HYDROXYZINE HCL 50 MG/ML IM SOLN: 50.0000 mg | Freq: Four times a day (QID) | INTRAMUSCULAR | Status: DC | PRN

## 2020-05-29 MED ORDER — ONDANSETRON HCL 4 MG/2ML IJ SOLN
INTRAMUSCULAR | Status: AC
  Filled 2020-05-29: qty 2

## 2020-05-29 MED ORDER — POTASSIUM CHLORIDE IN NACL 20-0.9 MEQ/L-% IV SOLN: INTRAVENOUS | Status: DC

## 2020-05-29 MED ORDER — SUMATRIPTAN SUCCINATE 50 MG PO TABS
50.0000 mg | ORAL_TABLET | Freq: Once | ORAL | Status: DC
  Filled 2020-05-29: qty 1

## 2020-05-29 MED ORDER — SODIUM CHLORIDE 0.9% FLUSH
3.0000 mL | INTRAVENOUS | Status: DC | PRN
  Administered 2020-05-29: 3 mL via INTRAVENOUS

## 2020-05-29 MED ORDER — ONDANSETRON HCL 4 MG PO TABS: 4.0000 mg | ORAL_TABLET | Freq: Every day | ORAL | Status: DC | PRN

## 2020-05-29 MED ORDER — ONDANSETRON HCL 4 MG/2ML IJ SOLN
INTRAMUSCULAR | Status: DC | PRN
  Administered 2020-05-29: 4 mg via INTRAVENOUS

## 2020-05-29 MED ORDER — 0.9 % SODIUM CHLORIDE (POUR BTL) OPTIME
TOPICAL | Status: DC | PRN
  Administered 2020-05-29: 1000 mL

## 2020-05-29 MED ORDER — ALUM & MAG HYDROXIDE-SIMETH 200-200-20 MG/5ML PO SUSP: 30.0000 mL | Freq: Four times a day (QID) | ORAL | Status: DC | PRN

## 2020-05-29 MED ORDER — LACTATED RINGERS IV SOLN: INTRAVENOUS | Status: DC | PRN

## 2020-05-29 MED ORDER — ROCURONIUM BROMIDE 10 MG/ML (PF) SYRINGE
PREFILLED_SYRINGE | INTRAVENOUS | Status: AC
  Filled 2020-05-29: qty 10

## 2020-05-29 MED ORDER — EPHEDRINE SULFATE-NACL 50-0.9 MG/10ML-% IV SOSY
PREFILLED_SYRINGE | INTRAVENOUS | Status: DC | PRN
  Administered 2020-05-29 (×3): 5 mg via INTRAVENOUS
  Administered 2020-05-29: 10 mg via INTRAVENOUS
  Administered 2020-05-29 (×2): 5 mg via INTRAVENOUS

## 2020-05-29 MED ORDER — THROMBIN 20000 UNITS EX SOLR
CUTANEOUS | Status: AC
  Filled 2020-05-29: qty 20000

## 2020-05-29 MED ORDER — FLEET ENEMA 7-19 GM/118ML RE ENEM: 1.0000 | ENEMA | Freq: Once | RECTAL | Status: DC | PRN

## 2020-05-29 MED ORDER — ORAL CARE MOUTH RINSE: 15.0000 mL | Freq: Once | OROMUCOSAL | Status: AC

## 2020-05-29 MED ORDER — THROMBIN 20000 UNITS EX SOLR: CUTANEOUS | Status: DC | PRN

## 2020-05-29 MED ORDER — LACTATED RINGERS IV SOLN: INTRAVENOUS | Status: DC

## 2020-05-29 MED ORDER — SODIUM CHLORIDE 0.9% FLUSH
3.0000 mL | Freq: Two times a day (BID) | INTRAVENOUS | Status: DC
  Administered 2020-05-30: 3 mL via INTRAVENOUS

## 2020-05-29 MED ORDER — EPHEDRINE 5 MG/ML INJ
INTRAVENOUS | Status: AC
  Filled 2020-05-29: qty 10

## 2020-05-29 MED ORDER — CEFAZOLIN SODIUM-DEXTROSE 2-4 GM/100ML-% IV SOLN
2.0000 g | INTRAVENOUS | Status: AC
  Administered 2020-05-29: 2 g via INTRAVENOUS
  Filled 2020-05-29: qty 100

## 2020-05-29 MED ORDER — SENNOSIDES-DOCUSATE SODIUM 8.6-50 MG PO TABS: 1.0000 | ORAL_TABLET | Freq: Every evening | ORAL | Status: DC | PRN

## 2020-05-29 MED ORDER — ALBUMIN HUMAN 5 % IV SOLN: INTRAVENOUS | Status: DC | PRN

## 2020-05-29 MED ORDER — BUPIVACAINE LIPOSOME 1.3 % IJ SUSP
20.0000 mL | INTRAMUSCULAR | Status: DC
  Filled 2020-05-29: qty 20

## 2020-05-29 MED ORDER — PANTOPRAZOLE SODIUM 40 MG PO TBEC
40.0000 mg | DELAYED_RELEASE_TABLET | Freq: Every day | ORAL | Status: DC
  Administered 2020-05-30: 40 mg via ORAL
  Filled 2020-05-29: qty 1

## 2020-05-29 MED ORDER — MENTHOL 3 MG MT LOZG: 1.0000 | LOZENGE | OROMUCOSAL | Status: DC | PRN

## 2020-05-29 MED ORDER — OXYCODONE-ACETAMINOPHEN 5-325 MG PO TABS
1.0000 | ORAL_TABLET | ORAL | Status: DC | PRN
  Administered 2020-05-29 – 2020-05-30 (×5): 2 via ORAL
  Filled 2020-05-29 (×5): qty 2

## 2020-05-29 MED ORDER — SUFENTANIL CITRATE 250 MCG/5ML IV SOLN
0.2500 ug/kg/h | INTRAVENOUS | Status: AC
  Administered 2020-05-29: .3 ug/kg/h via INTRAVENOUS
  Filled 2020-05-29 (×2): qty 5

## 2020-05-29 MED ORDER — GABAPENTIN 300 MG PO CAPS
900.0000 mg | ORAL_CAPSULE | Freq: Every day | ORAL | Status: DC
  Administered 2020-05-29: 900 mg via ORAL
  Filled 2020-05-29: qty 3

## 2020-05-29 MED ORDER — IMIPRAMINE HCL 10 MG PO TABS
30.0000 mg | ORAL_TABLET | Freq: Every day | ORAL | Status: DC
  Administered 2020-05-29: 30 mg via ORAL
  Filled 2020-05-29 (×2): qty 3

## 2020-05-29 MED ORDER — PROMETHAZINE HCL 25 MG/ML IJ SOLN
6.2500 mg | INTRAMUSCULAR | Status: DC | PRN
  Administered 2020-05-29: 6.25 mg via INTRAVENOUS

## 2020-05-29 MED ORDER — HYDROMORPHONE HCL 1 MG/ML IJ SOLN
0.2500 mg | INTRAMUSCULAR | Status: DC | PRN
  Administered 2020-05-29 (×2): 0.5 mg via INTRAVENOUS

## 2020-05-29 MED ORDER — METHOCARBAMOL 1000 MG/10ML IJ SOLN
500.0000 mg | Freq: Four times a day (QID) | INTRAVENOUS | Status: DC | PRN
  Filled 2020-05-29: qty 5

## 2020-05-29 MED ORDER — LIDOCAINE 2% (20 MG/ML) 5 ML SYRINGE
INTRAMUSCULAR | Status: DC | PRN
  Administered 2020-05-29: 100 mg via INTRAVENOUS

## 2020-05-29 MED ORDER — HYDROXYZINE HCL 25 MG PO TABS: 25.0000 mg | ORAL_TABLET | Freq: Four times a day (QID) | ORAL | Status: DC | PRN

## 2020-05-29 MED ORDER — ACETAMINOPHEN 325 MG PO TABS: 650.0000 mg | ORAL_TABLET | ORAL | Status: DC | PRN

## 2020-05-29 MED ORDER — BUPIVACAINE-EPINEPHRINE (PF) 0.25% -1:200000 IJ SOLN
INTRAMUSCULAR | Status: AC
  Filled 2020-05-29: qty 30

## 2020-05-29 MED ORDER — SCOPOLAMINE 1 MG/3DAYS TD PT72: 1.0000 | MEDICATED_PATCH | TRANSDERMAL | Status: DC

## 2020-05-29 MED ORDER — POVIDONE-IODINE 7.5 % EX SOLN: Freq: Once | CUTANEOUS | Status: DC

## 2020-05-29 MED ORDER — SODIUM CHLORIDE 0.9 % IV SOLN
250.0000 mL | INTRAVENOUS | Status: DC
  Administered 2020-05-29: 250 mL via INTRAVENOUS

## 2020-05-29 MED ORDER — HYDROMORPHONE HCL 1 MG/ML IJ SOLN
INTRAMUSCULAR | Status: AC
  Filled 2020-05-29: qty 1

## 2020-05-29 MED ORDER — ACETAMINOPHEN 650 MG RE SUPP: 650.0000 mg | RECTAL | Status: DC | PRN

## 2020-05-29 MED ORDER — AMPHETAMINE-DEXTROAMPHETAMINE 10 MG PO TABS
10.0000 mg | ORAL_TABLET | Freq: Every day | ORAL | Status: DC
  Filled 2020-05-29: qty 1

## 2020-05-29 MED ORDER — PHENOL 1.4 % MT LIQD: 1.0000 | OROMUCOSAL | Status: DC | PRN

## 2020-05-29 MED ORDER — BUPIVACAINE-EPINEPHRINE 0.25% -1:200000 IJ SOLN
INTRAMUSCULAR | Status: DC | PRN
  Administered 2020-05-29: 10 mL

## 2020-05-29 MED ORDER — MEPERIDINE HCL 25 MG/ML IJ SOLN: 6.2500 mg | INTRAMUSCULAR | Status: DC | PRN

## 2020-05-29 SURGICAL SUPPLY — 133 items
APPLIER CLIP 11 MED OPEN (CLIP) ×10
BENZOIN TINCTURE PRP APPL 2/3 (GAUZE/BANDAGES/DRESSINGS) ×10 IMPLANT
BLADE CLIPPER SURG (BLADE) ×5 IMPLANT
BONE VIVIGEN FORMABLE 5.4CC (Bone Implant) ×5 IMPLANT
BUR PRESCISION 1.7 ELITE (BURR) ×5 IMPLANT
BUR ROUND FLUTED 5 RND (BURR) ×4 IMPLANT
BUR ROUND FLUTED 5MM RND (BURR) ×1
BUR ROUND PRECISION 4.0 (BURR) IMPLANT
BUR ROUND PRECISION 4.0MM (BURR)
BUR SABER RD CUTTING 3.0 (BURR) IMPLANT
BUR SABER RD CUTTING 3.0MM (BURR)
CARTRIDGE OIL MAESTRO DRILL (MISCELLANEOUS) ×3 IMPLANT
CLIP APPLIE 11 MED OPEN (CLIP) ×6 IMPLANT
CLIP VESOCCLUDE MED 24/CT (CLIP) ×5 IMPLANT
CLIP VESOCCLUDE SM WIDE 24/CT (CLIP) ×5 IMPLANT
CLOSURE WOUND 1/2 X4 (GAUZE/BANDAGES/DRESSINGS) ×2
CNTNR URN SCR LID CUP LEK RST (MISCELLANEOUS) ×3 IMPLANT
CONT SPEC 4OZ STRL OR WHT (MISCELLANEOUS) ×2
COVER BACK TABLE 60X90IN (DRAPES) ×5 IMPLANT
COVER MAYO STAND STRL (DRAPES) ×10 IMPLANT
COVER SURGICAL LIGHT HANDLE (MISCELLANEOUS) ×5 IMPLANT
COVER WAND RF STERILE (DRAPES) ×10 IMPLANT
DERMABOND ADVANCED (GAUZE/BANDAGES/DRESSINGS) ×2
DERMABOND ADVANCED .7 DNX12 (GAUZE/BANDAGES/DRESSINGS) ×3 IMPLANT
DIFFUSER DRILL AIR PNEUMATIC (MISCELLANEOUS) ×5 IMPLANT
DRAIN CHANNEL 15F RND FF W/TCR (WOUND CARE) IMPLANT
DRAPE C-ARM 42X72 X-RAY (DRAPES) ×15 IMPLANT
DRAPE C-ARMOR (DRAPES) IMPLANT
DRAPE INCISE IOBAN 66X45 STRL (DRAPES) ×5 IMPLANT
DRAPE POUCH INSTRU U-SHP 10X18 (DRAPES) ×10 IMPLANT
DRAPE SURG 17X23 STRL (DRAPES) ×40 IMPLANT
DRSG MEPILEX BORDER 4X12 (GAUZE/BANDAGES/DRESSINGS) ×5 IMPLANT
DURAPREP 26ML APPLICATOR (WOUND CARE) ×10 IMPLANT
ELECT BLADE 4.0 EZ CLEAN MEGAD (MISCELLANEOUS) ×10
ELECT CAUTERY BLADE 6.4 (BLADE) ×10 IMPLANT
ELECT REM PT RETURN 9FT ADLT (ELECTROSURGICAL) ×10
ELECTRODE BLDE 4.0 EZ CLN MEGD (MISCELLANEOUS) ×6 IMPLANT
ELECTRODE REM PT RTRN 9FT ADLT (ELECTROSURGICAL) ×6 IMPLANT
EVACUATOR SILICONE 100CC (DRAIN) IMPLANT
FEE INTRAOP MONITOR IMPULS NCS (MISCELLANEOUS) ×3 IMPLANT
FILTER STRAW FLUID ASPIR (MISCELLANEOUS) ×5 IMPLANT
GAUZE 4X4 16PLY RFD (DISPOSABLE) ×5 IMPLANT
GAUZE SPONGE 4X4 12PLY STRL (GAUZE/BANDAGES/DRESSINGS) ×10 IMPLANT
GLOVE BIO SURGEON STRL SZ7 (GLOVE) ×5 IMPLANT
GLOVE BIO SURGEON STRL SZ8 (GLOVE) ×5 IMPLANT
GLOVE BIOGEL PI IND STRL 7.0 (GLOVE) ×9 IMPLANT
GLOVE BIOGEL PI IND STRL 7.5 (GLOVE) ×3 IMPLANT
GLOVE BIOGEL PI IND STRL 8 (GLOVE) ×9 IMPLANT
GLOVE BIOGEL PI INDICATOR 7.0 (GLOVE) ×6
GLOVE BIOGEL PI INDICATOR 7.5 (GLOVE) ×2
GLOVE BIOGEL PI INDICATOR 8 (GLOVE) ×6
GLOVE SURG SS PI 7.5 STRL IVOR (GLOVE) ×5 IMPLANT
GOWN STRL REUS W/ TWL LRG LVL3 (GOWN DISPOSABLE) ×21 IMPLANT
GOWN STRL REUS W/ TWL XL LVL3 (GOWN DISPOSABLE) ×15 IMPLANT
GOWN STRL REUS W/TWL LRG LVL3 (GOWN DISPOSABLE) ×14
GOWN STRL REUS W/TWL XL LVL3 (GOWN DISPOSABLE) ×10
GUIDEWIRE SHARP VIPER II (WIRE) ×30 IMPLANT
HEMOSTAT SURGICEL 2X14 (HEMOSTASIS) IMPLANT
INSERT FOGARTY 61MM (MISCELLANEOUS) IMPLANT
INSERT FOGARTY SM (MISCELLANEOUS) IMPLANT
INTRAOP MONITOR FEE IMPULS NCS (MISCELLANEOUS) ×3
INTRAOP MONITOR FEE IMPULSE (MISCELLANEOUS) ×2
IV CATH 14GX2 1/4 (CATHETERS) ×5 IMPLANT
KIT BASIN OR (CUSTOM PROCEDURE TRAY) ×10 IMPLANT
KIT POSITION SURG JACKSON T1 (MISCELLANEOUS) ×5 IMPLANT
KIT TURNOVER KIT B (KITS) ×15 IMPLANT
LOOP VESSEL MAXI BLUE (MISCELLANEOUS) ×5 IMPLANT
LOOP VESSEL MINI RED (MISCELLANEOUS) ×5 IMPLANT
MARKER SKIN DUAL TIP RULER LAB (MISCELLANEOUS) ×10 IMPLANT
NEEDLE 18GX1X1/2 (RX/OR ONLY) (NEEDLE) ×5 IMPLANT
NEEDLE 22X1 1/2 (OR ONLY) (NEEDLE) ×10 IMPLANT
NEEDLE HYPO 25GX1X1/2 BEV (NEEDLE) ×10 IMPLANT
NEEDLE SPNL 18GX3.5 QUINCKE PK (NEEDLE) ×15 IMPLANT
NS IRRIG 1000ML POUR BTL (IV SOLUTION) ×10 IMPLANT
OIL CARTRIDGE MAESTRO DRILL (MISCELLANEOUS) ×5
PACK LAMINECTOMY NEURO (CUSTOM PROCEDURE TRAY) ×10 IMPLANT
PACK LAMINECTOMY ORTHO (CUSTOM PROCEDURE TRAY) ×5 IMPLANT
PACK UNIVERSAL I (CUSTOM PROCEDURE TRAY) ×10 IMPLANT
PAD ARMBOARD 7.5X6 YLW CONV (MISCELLANEOUS) ×20 IMPLANT
PATTIES SURGICAL .5 X1 (DISPOSABLE) ×5 IMPLANT
PATTIES SURGICAL .5X1.5 (GAUZE/BANDAGES/DRESSINGS) ×5 IMPLANT
PROBE PED SCREW MONOP 3 BT NCS (MISCELLANEOUS) ×3 IMPLANT
ROD VIPER II LORDOSED 5.5X40 (Rod) ×10 IMPLANT
SCREW CORTICAL VIPER 7X40MM (Screw) ×10 IMPLANT
SCREW CORTICAL VIPER 7X45MM (Screw) ×10 IMPLANT
SCREW PROBE PEDICLE MONOPOLAR (MISCELLANEOUS) ×2
SCREW SET SINGLE INNER MIS (Screw) ×20 IMPLANT
SPONGE INTESTINAL PEANUT (DISPOSABLE) ×25 IMPLANT
SPONGE LAP 18X18 RF (DISPOSABLE) IMPLANT
SPONGE LAP 4X18 RFD (DISPOSABLE) IMPLANT
SPONGE SURGIFOAM ABS GEL 100 (HEMOSTASIS) ×10 IMPLANT
STRIP CLOSURE SKIN 1/2X4 (GAUZE/BANDAGES/DRESSINGS) ×8 IMPLANT
SURGIFLO W/THROMBIN 8M KIT (HEMOSTASIS) IMPLANT
SUT MNCRL AB 4-0 PS2 18 (SUTURE) ×15 IMPLANT
SUT PDS AB 1 CTX 36 (SUTURE) ×10 IMPLANT
SUT PROLENE 4 0 RB 1 (SUTURE) ×8
SUT PROLENE 4-0 RB1 .5 CRCL 36 (SUTURE) ×12 IMPLANT
SUT PROLENE 5 0 C 1 24 (SUTURE) IMPLANT
SUT PROLENE 5 0 CC1 (SUTURE) IMPLANT
SUT PROLENE 6 0 C 1 30 (SUTURE) ×5 IMPLANT
SUT PROLENE 6 0 CC (SUTURE) IMPLANT
SUT SILK 0 TIES 10X30 (SUTURE) ×5 IMPLANT
SUT SILK 2 0 TIES 10X30 (SUTURE) ×10 IMPLANT
SUT SILK 2 0SH CR/8 30 (SUTURE) IMPLANT
SUT SILK 3 0 TIES 10X30 (SUTURE) ×10 IMPLANT
SUT VIC AB 0 CT1 18XCR BRD 8 (SUTURE) ×3 IMPLANT
SUT VIC AB 0 CT1 27 (SUTURE) ×2
SUT VIC AB 0 CT1 27XBRD ANBCTR (SUTURE) ×3 IMPLANT
SUT VIC AB 0 CT1 8-18 (SUTURE) ×2
SUT VIC AB 1 CT1 18XCR BRD 8 (SUTURE) ×3 IMPLANT
SUT VIC AB 1 CT1 27 (SUTURE)
SUT VIC AB 1 CT1 27XBRD ANBCTR (SUTURE) IMPLANT
SUT VIC AB 1 CT1 8-18 (SUTURE) ×2
SUT VIC AB 1 CTX 36 (SUTURE)
SUT VIC AB 1 CTX36XBRD ANBCTR (SUTURE) IMPLANT
SUT VIC AB 2-0 CP2 18 (SUTURE) ×5 IMPLANT
SUT VIC AB 2-0 CT1 27 (SUTURE) ×2
SUT VIC AB 2-0 CT1 TAPERPNT 27 (SUTURE) ×3 IMPLANT
SUT VIC AB 2-0 CT2 18 VCP726D (SUTURE) ×10 IMPLANT
SYNCAGE EVOL SM 13.5X8.9 14D (Spacer) ×5 IMPLANT
SYR 20ML LL LF (SYRINGE) ×10 IMPLANT
SYR BULB IRRIG 60ML STRL (SYRINGE) ×10 IMPLANT
SYR CONTROL 10ML LL (SYRINGE) ×10 IMPLANT
SYR TB 1ML LUER SLIP (SYRINGE) ×5 IMPLANT
TAP CANN VIPER2 DL 5.0 (TAP) ×10 IMPLANT
TAP CANN VIPER2 DL 6.0 (TAP) ×10 IMPLANT
TAP CANN VIPER2 DL 7.0 (TAP) ×10 IMPLANT
TAPE CLOTH SURG 4X10 WHT LF (GAUZE/BANDAGES/DRESSINGS) ×10 IMPLANT
TOWEL GREEN STERILE (TOWEL DISPOSABLE) ×5 IMPLANT
TOWEL GREEN STERILE FF (TOWEL DISPOSABLE) ×5 IMPLANT
TRAY FOLEY MTR SLVR 16FR STAT (SET/KITS/TRAYS/PACK) ×5 IMPLANT
WATER STERILE IRR 1000ML POUR (IV SOLUTION) ×10 IMPLANT
YANKAUER SUCT BULB TIP NO VENT (SUCTIONS) ×10 IMPLANT

## 2020-05-29 NOTE — Op Note (Signed)
    Patient name: KARELI HOSSAIN MRN: [id number] DOB: [birth year] Sex: female  05/29/2020 Pre-operative Diagnosis: Degenerative lower back disease Post-operative diagnosis:  Same Surgeon:  [name] Co-surgeon: Estill Bamberg Procedure:   Anterior exposure L5-S1 Anesthesia: General Blood Loss: Minimal Specimens: None  Findings: Normal anatomy  Indications: This is a 44 year old female with significant lower back pain.  L5-S1 instrumentation from an anterior approach was recommended by Dr. Yevette Edwards.  I discussed my role in the procedure with the patient at her preoperative visit.  All questions were answered today.  Procedure:  The patient was identified in the holding area and taken to [facility] OR ROOM 18  The patient was then placed supine on the table. general anesthesia was administered.  The patient was prepped and draped in the usual sterile fashion.  A time out was called and antibiotics were administered.  Fluoroscopy was used to identify the L5-S1 disc space.  A transverse left lower quadrant incision was made beginning at the midline extending laterally to the edge of the rectus muscle.  Cautery was used to divide subcutaneous tissue down to the anterior abdominal wall fascia which was opened with cautery.  Subfascial flaps were then raised.  I entered the retroperitoneal space lateral to the rectus muscle.  The abdominal contents were swept medially and superiorly until I identified the left iliac artery.  Blunt dissection was used to free up the tissue over top of the artery.  The vein was then visualized.  Next using a Kitner, the tissue over top of the spine was bluntly mobilized.  The median sacral vessels were divided with cautery.  I mobilized the tissue until I exposed the right lateral side of the spine.  At this point the [facility] retractor was set up.  A 150 reverse lip blade was placed on the right lateral side of the spine.  I then continued to mobilize the left iliac vein until  I could place a 150 reverse lip blade on the left side of the spine.  A 180 malleable retractor was placed inferiorly.  A spinal needle was placed into the disc space and fluoroscopy came in and confirmed that we were at the appropriate level.  At this point, Dr. Yevette Edwards took over the procedure.  Please see his operative note for remaining details.  There were no immediate complications.   Disposition: To PACU stable.   Juleen China, M.D., [facility] of [place] Office: [phone] Pager:  [phone]

## 2020-05-29 NOTE — H&P (Signed)
PREOPERATIVE H&P  Chief Complaint: Left leg pain  HPI: Heather Short is a 44 y.o. female who presents with ongoing pain in the left leg s/p a work injury dated 11/27/2019  MRI reveals an L5 pars defect, and severe compression of the left L5 nerve  Patient has failed multiple forms of conservative care and continues to have pain (see office notes for additional details regarding the patient's full course of treatment)  Past Medical History:  Diagnosis Date  . Anxiety   . Complication of anesthesia   . GERD (gastroesophageal reflux disease)   . Headache    migranine last one Oct 12 ish  . Leg pain    left  . Low back pain   . PONV (postoperative nausea and vomiting)    severe vomiting  . PTSD (post-traumatic stress disorder)   . Spondylolisthesis at L5-S1 level   . SVT (supraventricular tachycardia) (HCC) 2008   during preganancy   Past Surgical History:  Procedure Laterality Date  . BREAST LUMPECTOMY Right   . CESAREAN SECTION    . TONSILLECTOMY    . uterine ablation     Social History   Socioeconomic History  . Marital status: Married    Spouse name: Not on file  . Number of children: Not on file  . Years of education: Not on file  . Highest education level: Not on file  Occupational History  . Not on file  Tobacco Use  . Smoking status: Current Every Day Smoker    Packs/day: 0.20    Years: 20.00    Pack years: 4.00    Types: Cigarettes  . Smokeless tobacco: Never Used  Vaping Use  . Vaping Use: Never used  Substance and Sexual Activity  . Alcohol use: Yes    Comment: occasional   . Drug use: Never  . Sexual activity: Not on file  Other Topics Concern  . Not on file  Social History Narrative  . Not on file   Social Determinants of Health   Financial Resource Strain:   . Difficulty of Paying Living Expenses: Not on file  Food Insecurity:   . Worried About Programme researcher, broadcasting/film/video in the Last Year: Not on file  . Ran Out of Food in the Last  Year: Not on file  Transportation Needs:   . Lack of Transportation (Medical): Not on file  . Lack of Transportation (Non-Medical): Not on file  Physical Activity:   . Days of Exercise per Week: Not on file  . Minutes of Exercise per Session: Not on file  Stress:   . Feeling of Stress : Not on file  Social Connections:   . Frequency of Communication with Friends and Family: Not on file  . Frequency of Social Gatherings with Friends and Family: Not on file  . Attends Religious Services: Not on file  . Active Member of Clubs or Organizations: Not on file  . Attends Banker Meetings: Not on file  . Marital Status: Not on file   History reviewed. No pertinent family history. Allergies  Allergen Reactions  . Eletriptan     Other reaction(s): Other Patient reports her throat swells when she takes relpax  . Midazolam Hives    Syncope vomitting  . Topiramate     Other reaction(s): Confusion  . Latex Hives   Prior to Admission medications   Medication Sig Start Date End Date Taking? Authorizing Provider  amphetamine-dextroamphetamine (ADDERALL) 10 MG tablet Take 10  mg by mouth daily. 04/04/20  Yes [provider]  buPROPion (WELLBUTRIN XL) 300 MG 24 hr tablet Take 300 mg by mouth every morning. 04/01/20  Yes [provider]  cyclobenzaprine (FLEXERIL) 5 MG tablet Take 10 mg by mouth at bedtime.  03/27/20  Yes [provider]  frovatriptan (FROVA) 2.5 MG tablet Take 205 mg by mouth daily as needed for migraine. 11/13/19  Yes [provider]  gabapentin (NEURONTIN) 300 MG capsule Take 900 mg by mouth at bedtime.  04/03/20  Yes [provider]  hydrOXYzine (VISTARIL) 25 MG capsule Take 25 mg by mouth every 6 (six) hours as needed (Migraine).  12/29/17  Yes [provider]  imipramine (TOFRANIL) 10 MG tablet Take 30 mg by mouth at bedtime. 01/11/20  Yes [provider]  omeprazole (PRILOSEC) 40 MG capsule Take 40 mg by mouth  daily. 03/11/20  Yes [provider]  ondansetron (ZOFRAN) 4 MG tablet Take 4 mg by mouth daily as needed (Migraine).  09/13/19  Yes [provider]  tiZANidine (ZANAFLEX) 4 MG capsule Take 4 mg by mouth daily as needed (Back pain).  12/11/19  Yes [provider]  traZODone (DESYREL) 100 MG tablet Take 100 mg by mouth at bedtime. 03/15/20  Yes [provider]     All other systems have been reviewed and were otherwise negative with the exception of those mentioned in the HPI and as above.  Physical Exam: Vitals:   05/29/20 0651  BP: (!) 144/71  Pulse: 81  Resp: 20  Temp: 98 F (36.7 C)  SpO2: 99%    Body mass index is 35.57 kg/m.  General: Alert, no acute distress Cardiovascular: No pedal edema Respiratory: No cyanosis, no use of accessory musculature Skin: No lesions in the area of chief complaint Neurologic: Sensation intact distally Psychiatric: Patient is competent for consent with normal mood and affect Lymphatic: No axillary or cervical lymphadenopathy   Assessment/Plan: LEFT LEG PAIN, BILATERAL LUMBAR 5 PARS DEFECT, SPONDYLOLISTHESIS, SEVERE BILATERAL NEUROFORAMINAL STENOSIS Plan for Procedure(s): LUMBAR 5 - SACRUM 1 ANTERIOR LUMBAR INTERBODY FUSION WITH INSTRUMENTATION AND ALLOGRAFT POSTERIOR SPINAL FUSION LUMBAR 5 - SACRUM 1 WITH INSTRUMENTATION AND ALLOGRAFT ABDOMINAL EXPOSURE   Jackelyn Hoehn, MD 05/29/2020 8:17 AM

## 2020-05-29 NOTE — Interval H&P Note (Signed)
History and Physical Interval Note:  05/29/2020 7:04 AM  Heather Short  has presented today for surgery, with the diagnosis of LEFT LEG PAIN, BILATERAL LUMBAR 5 PARS DEFECT, SPONDYLOLISTHESIS, SEVERE BILATERAL NEUROFORAMINAL STENOSIS.  The various methods of treatment have been discussed with the patient and family. After consideration of risks, benefits and other options for treatment, the patient has consented to  Procedure(s): LUMBAR 5 - SACRUM 1 ANTERIOR LUMBAR INTERBODY FUSION WITH INSTRUMENTATION AND ALLOGRAFT (N/A) POSTERIOR SPINAL FUSION LUMBAR 5 - SACRUM 1 WITH INSTRUMENTATION AND ALLOGRAFT (N/A) ABDOMINAL EXPOSURE (N/A) as a surgical intervention.  The patient's history has been reviewed, patient examined, no change in status, stable for surgery.  I have reviewed the patient's chart and labs.  Questions were answered to the patient's satisfaction.     [name]

## 2020-05-29 NOTE — Progress Notes (Signed)
PT Cancellation Note  Patient Details Name: Heather Short MRN: [id number] DOB: Nov 15, 1975   Cancelled Treatment:    Reason Eval/Treat Not Completed: Other (comment) Pt walked with nurse tech earlier.  Upon PT arrival, pt crying/sobbing in bed due to L hip pain.  Attempted to help reposition with knees of bed flexed , no improvement.  Assisted into R sidelying with pillow between knee.  No significant improvement.  Refilled pt's ice pack and notified RN pt requesting pain meds.  Will f/u at later time and pt's pain is severe and limiting ability to move at this time.   Anise Salvo, PT Acute Rehab Services Pager [phone] [facility] [phone]    Rayetta Humphrey 05/29/2020, 5:49 PM

## 2020-05-29 NOTE — Transfer of Care (Signed)
Immediate Anesthesia Transfer of Care Note  Patient: Heather Short  Procedure(s) Performed: LUMBAR FIVE - SACRUM ONE ANTERIOR LUMBAR INTERBODY FUSION WITH INSTRUMENTATION AND ALLOGRAFT (N/A Abdomen) POSTERIOR SPINAL FUSION LUMBAR FIVE - SACRUM ONE WITH INSTRUMENTATION (N/A Spine Lumbar) ABDOMINAL EXPOSURE (N/A )  Patient Location: PACU  Anesthesia Type:General  Level of Consciousness: drowsy, patient cooperative and responds to stimulation  Airway & Oxygen Therapy: Patient Spontanous Breathing and Patient connected to face mask oxygen  Post-op Assessment: Report given to RN and Post -op Vital signs reviewed and stable  Post vital signs: Reviewed  Last Vitals:  Vitals Value Taken Time  BP 106/59 05/29/20 1308  Temp    Pulse 91 05/29/20 1312  Resp 14 05/29/20 1312  SpO2 100 % 05/29/20 1312  Vitals shown include unvalidated device data.  Last Pain:  Vitals:   05/29/20 0651  TempSrc: Oral         Complications: No complications documented.

## 2020-05-29 NOTE — Anesthesia Procedure Notes (Signed)
Arterial Line Insertion Start/End10/27/2021 8:00 AM, 05/29/2020 8:15 AM Performed by: Audie Pinto, CRNA, CRNA  Patient location: Pre-op. Preanesthetic checklist: patient identified, IV checked and risks and benefits discussed Lidocaine 1% used for infiltration and patient sedated Left, radial was placed Catheter size: 20 G Hand hygiene performed  and maximum sterile barriers used  Allen's test indicative of satisfactory collateral circulation Attempts: 1 Procedure performed without using ultrasound guided technique. Following insertion, dressing applied and Biopatch. Post procedure assessment: unchanged  Patient tolerated the procedure well with no immediate complications.

## 2020-05-29 NOTE — Anesthesia Procedure Notes (Signed)
Procedure Name: Intubation Date/Time: 05/29/2020 8:47 AM Performed by: Janene Harvey, CRNA Pre-anesthesia Checklist: Patient identified, Emergency Drugs available, Suction available and Patient being monitored Patient Re-evaluated:Patient Re-evaluated prior to induction Oxygen Delivery Method: Circle system utilized Preoxygenation: Pre-oxygenation with 100% oxygen Induction Type: IV induction Ventilation: Mask ventilation without difficulty Laryngoscope Size: Mac and 4 Grade View: Grade I Tube type: Oral Tube size: 7.0 mm Number of attempts: 1 Airway Equipment and Method: Stylet and Oral airway Placement Confirmation: ETT inserted through vocal cords under direct vision,  positive ETCO2 and breath sounds checked- equal and bilateral Secured at: 22 cm Tube secured with: Tape Dental Injury: Teeth and Oropharynx as per pre-operative assessment

## 2020-05-29 NOTE — Op Note (Signed)
PATIENT NAME: Heather Short   MEDICAL RECORD NO.:   [id number]    DATE OF BIRTH: March 02, 1976   DATE OF PROCEDURE: 05/29/2020                              OPERATIVE REPORT   PREOPERATIVE DIAGNOSES: 1. Left L5 radiculopathy 2. Severe left greater than right neuroforaminal stenosis 3. Bilateral L5 pars interarticularis defect 4. L5/S1 spondylolisthesis  POSTOPERATIVE DIAGNOSES: 1. Left L5 radiculopathy 2. Severe left greater than right neuroforaminal stenosis 3. Bilateral L5 pars interarticularis defect 4. L5/S1 spondylolisthesis  PROCEDURE: 1. Anterior lumbar interbody fusion, L5-S1 (expsure peformed by Dr. Nada Libman) 2. Insertion of interbody device x1 (Syncage intervertebral spacer, 13.12mm, small, 10 degree lordotic). 3. Placement of anterior instrumentation securing the L5-S1 level. 4. Intraoperative use of fluoroscopy. 5. Use of morselized allograft - Vivigen 6. Co-surgeon with Dr. Nada Libman for retroperitoneal exposure 7. L5-S1 posterior spinal fusion 8. Placement of posterior instrumentation L5, S1, bilaterally  SURGEON:  Estill Bamberg, MD  ASSISTANT:  Jason Coop, PA-C  ANESTHESIA:  General endotracheal anesthesia.  COMPLICATIONS:  None.  DISPOSITION:  Stable.  ESTIMATED BLOOD LOSS:  minimal  INDICATIONS FOR SURGERY:  Briefly, Heather Short is a very pleasant 44 year old female, who has been having progressive debilitating pain in the left leg status post a work injury from 6 months prior.  Her MRI did reveal the findings noted above.  She has been having ongoing debilitating pain in the left leg. The patient did fail appropriate nonoperative measures, but did continue to have significant pain.  Given her ongoing pain and dysfunction, we did discuss proceeding with the procedure noted above.  The patient was fully aware of the risks and limitations associated with surgery, and did wish to proceed.     OPERATIVE DETAILS:  On 05/29/2020, the patient  was brought to surgery and general endotracheal anesthesia was administered.  The patient was placed supine on the hospital bed.  The patient's abdomen was prepped and draped in the usual sterile fashion.  An anterior retroperitoneal approach was then performed by Dr. Nada Libman.  I did function as his co-surgeon during the approach.  Once the anterior lumbar spine was noted, we did focus our attention on the L5-S1 intervertebral space.  I then performed a thorough and complete L5-S1 intervertebral diskectomy to the level of the posterior longitudinal ligament.   I was very pleased with the very thorough discectomy I was able to accomplish. The endplates were then appropriately prepared and the appropriate sized anterior intervertebral spacer was packed with Vivigen and tamped into position. I was very pleased with the press-fit of the implant.  I then proceeded with placement of anterior instrumentation at the L5-S1 intervertebral space.  To accomplish this, I did use an awl to prepare the trajectory of anterior vertebral body screw.  An anterior fixation device was attached to the back end of the screw and did provide anterior fixation across the L5-S1 intervertebral space, securing the intervertebral implant into place.  I was very pleased with the press-fit of the anterior hardware.  I did liberally use AP and lateral fluoroscopy to ensure that the implant and anterior fixation was in the appropriate position, and was very pleased with the radiographs. The wound was copiously irrigated.  The fascia was closed using #1 PDS.  The subcutaneous layer was closed using 0 Vicryl followed by 2-0 Vicryl, and the skin was  closed using 4-0 Monocryl. Benzoin and Steri-Strips were applied followed by sterile dressing.   All instrument counts were correct at the termination of this portion of the procedure.  At this point, the patient was placed prone on a well-padded Jackson bed with a spinal  frame. All bony prominences were protected and the back was prepped and draped in the usual sterile fashion. A timeout procedure was once again performed. AP and lateral fluoroscopy was brought into the field. The pedicles of L5 and S1 were demarcated bilaterally. I then made bilateral paramedian incisions. At this point, Jamshidi's were advanced across the L5 and S1 pedicles on the right and left sides. Guidewires were then placed. At this point, the left posteriolateral gutter was exposed, and was decorticated using a high-speed bur. The remainder of the Vivigen was packed into the posteriolateral gutter, to help aid in the success of the fusion. At this point, 7 mm taps were advanced across the guidewires bilaterally across the L5 and S1 pedicles. 7 mm screws were then advanced over the guidewires, and into the L5 and S1 pedicles bilaterally. The guidewires were removed. 40 mm rods were then secured into the tulip heads of the screws on the right and left sides. Caps were then placed followed by final locking procedure. I was very pleased with the final construct on AP and lateral imaging.  At this point, the wound was copiously irrigated using normal saline. The bilateral wounds were then closed using #1 Vicryl, followed by 2-0 Vicryl, followed by 4-0 Monocryl. Benzoin and Steri-Strips were applied followed by sterile dressing.  All instrument counts were correct at the termination of this portion of the procedure.  I did use neurologic monitoring throughout the surgery, and there was no abnormal EMG activity noted throughout the surgery.  Of note, Jason Coop was my assistant throughout surgery, and did aid in retraction, suctioning, placement of the hardware, and closure for both the anterior and posterior portions of the procedure.   Estill Bamberg, MD

## 2020-05-30 ENCOUNTER — Inpatient Hospital Stay (HOSPITAL_COMMUNITY): Payer: No Typology Code available for payment source

## 2020-05-30 ENCOUNTER — Other Ambulatory Visit: Payer: Self-pay

## 2020-05-30 ENCOUNTER — Encounter (HOSPITAL_COMMUNITY): Payer: Self-pay | Admitting: Orthopedic Surgery

## 2020-05-30 MED ORDER — METHOCARBAMOL 500 MG PO TABS: 500.0000 mg | ORAL_TABLET | Freq: Four times a day (QID) | ORAL | 1 refills | Status: AC | PRN

## 2020-05-30 MED ORDER — OXYCODONE-ACETAMINOPHEN 5-325 MG PO TABS
1.0000 | ORAL_TABLET | ORAL | 0 refills | Status: AC | PRN
Start: 2020-05-30 — End: ?

## 2020-05-30 NOTE — Progress Notes (Signed)
Patient is discharged from room 3C02 at this time. Alert and in stable condition. IV site d/c'd and instructions read to patient and spouse with understanding verbalized and all questions answered. Left unit via wheelchair with all belongings at side.  ?

## 2020-05-30 NOTE — Progress Notes (Signed)
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 Completed all education regarding compensatory strategies for ADL and functional mobility for ADL. Husband present for session. Pt will benefit from use of 3in1 and RW to increase independence while following back precautions and reduce pain with ADL and mobility. Discussed AE recommendations for ADL with pt/husband. Pt able to reach mod I level with use of AE. Pt with increased complaints of L hip pain. Using RW significantly helped mobility. No further OT needed. OT signing off.    05/30/20 1200  OT Visit Information  Last OT Received On 05/30/20  Assistance Needed +1  History of Present Illness Pt is 44 yo female with ongoing pain in L leg s/p a work injury on 11/27/19.  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  She is now s/p anterior lumbar interbody fusion of L5-S1 and posterior posterior spinal fusion.  Precautions  Precautions Back  Precaution Booklet Issued Yes (comment)  Precaution Comments Pt recalled precautions  Required Braces or Orthoses Spinal Brace  Spinal Brace TLSO;Applied in sitting position  Home Living  Family/patient expects to be discharged to: Private residence  Living Arrangements Spouse/significant other;Children  Available Help at Discharge Family;Available 24 hours/day  Type of Home House  Home Access Stairs to enter  Entrance Stairs-Number of Steps 7  Entrance Stairs-Rails Right;Left  Home Layout Multi-level;Other (Comment);Bed/bath upstairs  Alternate Level Stairs-Number of Steps 5  Alternate Level Stairs-Rails Right  Bathroom Shower/Tub Tub/shower unit;Curtain  Horticulturist, commercial Yes  How Accessible Accessible via walker  Home Equipment None  Prior Function  Level of Independence Independent  Communication  Communication No difficulties  Pain Assessment  Pain Assessment 0-10  Pain Score 7   Pain Location L hip  Pain Descriptors / Indicators Stabbing  Pain Intervention(s) Limited activity within patient's tolerance  Cognition  Arousal/Alertness Awake/alert  Behavior During Therapy WFL for tasks assessed/performed  Overall Cognitive Status Within Functional Limits for tasks assessed  Upper Extremity Assessment  Upper Extremity Assessment Overall WFL for tasks assessed  Lower Extremity Assessment  Lower Extremity Assessment Defer to PT evaluation  RLE Deficits / Details favoring LLE due to hip pain  Cervical / Trunk Assessment  Cervical / Trunk Assessment Other exceptions (back sx)  ADL  Overall ADL's  Needs assistance/impaired  Functional mobility during ADLs Supervision/safety;Rolling walker  General ADL Comments Educated opt on compensatory strategies for LB ADL; ususally able to complete figure four positioning however more difficult after surgery. Requires min A for LB dressing. Educated on use of reacher to help with pants/underwear. Educated on grooming and use of toilet tong for pericare. Pt verbalzied understanding. Recommend pt use 3in1 for shower seat in addition to over commode to increase independence with transfers and decrease pain. Husband present for session adn verbalized understanding  Bed Mobility  Overal bed mobility Needs Assistance  Rolling Supervision  Sit to sidelying Supervision  Transfers  Overall transfer level Needs assistance  Transfers Sit to/from Stand  Sit to Stand Supervision  Balance  Overall balance assessment Needs assistance  Sitting balance-Leahy Scale Good  Standing balance-Leahy Scale Fair  OT - End of Session  Equipment Utilized During Treatment Rolling walker;Back brace  Activity Tolerance Patient tolerated treatment well  Patient left in bed;with call bell/phone within reach;with family/visitor present  Nurse Communication Mobility status;Other (comment) (DC needs)  OT Assessment  OT Recommendation/Assessment Patient does  not need  any further OT services  OT Visit Diagnosis Other abnormalities of gait and mobility (R26.89);Muscle weakness (generalized) (M62.81);Pain  Pain - Right/Left Left  Pain - part of body Hip  OT Problem List Decreased strength;Impaired balance (sitting and/or standing);Decreased knowledge of use of DME or AE;Decreased knowledge of precautions;Pain  AM-PAC OT "6 Clicks" Daily Activity Outcome Measure (Version 2)  Help from another person eating meals? 4  Help from another person taking care of personal grooming? 3  Help from another person toileting, which includes using toliet, bedpan, or urinal? 3  Help from another person bathing (including washing, rinsing, drying)? 3  Help from another person to put on and taking off regular upper body clothing? 3  Help from another person to put on and taking off regular lower body clothing? 3  6 Click Score 19  OT Recommendation  Follow Up Recommendations No OT follow up;Supervision - Intermittent  OT Equipment 3 in 1 bedside commode  Acute Rehab OT Goals  Patient Stated Goal decrease L hip pain  OT Goal Formulation All assessment and education complete, DC therapy  OT Time Calculation  OT Start Time (ACUTE ONLY) 0953  OT Stop Time (ACUTE ONLY) 1026  OT Time Calculation (min) 33 min  OT General Charges  $OT Visit 1 Visit  OT Evaluation  $OT Eval Low Complexity 1 Low  OT Treatments  $Self Care/Home Management  8-22 mins  Written Expression  Dominant Hand Right  Luisa Dago, OT/L   Acute OT Clinical Specialist Acute Rehabilitation Services Pager [phone] Office [phone]

## 2020-05-30 NOTE — Progress Notes (Signed)
    Patient doing well Had severe left hip pain last night which has improved. Now c/o 5-6/10 pain in left buttock. Has been ambulating   Physical Exam: Vitals:   05/29/20 2317 05/30/20 0408  BP: 117/62 (!) 101/56  Pulse: 98 92  Resp: 20 20  Temp: 98.5 F (36.9 C) 98.5 F (36.9 C)  SpO2: 93% 94%    Dressing in place NVI  POD #1 s/p L5/S1 A/P fusion, doing well with improved but ongoing left buttock pain. This is likely muscular, but will proceed with a CT of lumbar spine to r/o possibility of radicular pain.  - up with PT/OT, encourage ambulation - Percocet for pain, Robaxin for muscle spasms - possible d/c home later today with f/u in 2 weeks depending on progress and CT images

## 2020-05-30 NOTE — Anesthesia Postprocedure Evaluation (Signed)
Anesthesia Post Note  Patient: [name]  Procedure(s) Performed: LUMBAR FIVE - SACRUM ONE ANTERIOR LUMBAR INTERBODY FUSION WITH INSTRUMENTATION AND ALLOGRAFT (N/A Abdomen) POSTERIOR SPINAL FUSION LUMBAR FIVE - SACRUM ONE WITH INSTRUMENTATION (N/A Spine Lumbar) ABDOMINAL EXPOSURE (N/A )     Patient location during evaluation: PACU Anesthesia Type: General Level of consciousness: sedated and patient cooperative Pain management: pain level controlled Vital Signs Assessment: post-procedure vital signs reviewed and stable Respiratory status: spontaneous breathing Cardiovascular status: stable Anesthetic complications: no   No complications documented.  Last Vitals:  Vitals:   05/30/20 0727 05/30/20 1139  BP: 110/68 (!) 98/55  Pulse: 84 75  Resp: 18 18  Temp: 37 C 36.9 C  SpO2: 97% 94%    Last Pain:  Vitals:   05/30/20 1139  TempSrc: Oral  PainSc:                  Heather Short

## 2020-05-30 NOTE — Evaluation (Signed)
Physical Therapy Evaluation Patient Details Name: Heather Short MRN: [id number] DOB: 1976-05-23 Today's Date: 05/30/2020   History of Present Illness  Pt is 44 yo female with ongoing pain in L leg s/p a work injury on 11/27/19.  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  She is now s/p anterior lumbar interbody fusion of L5-S1 and posterior posterior spinal fusion on 05/29/20.  Clinical Impression  Patient is s/p above surgery resulting in the deficits listed below (see PT Problem List). Pt presenting with decreased mobility, strength, endurance, and balance.  She has pain in L hip/buttock radiating to posterior L thigh that is worse that prior to sx but improved since yesterday.  Ambulation distance and speed limited due to the L hip pain.  Pt did demonstrate good understanding and adherence to back precautions.  She does live in split level home and was able to do stairs with min guard.  Pt with good family support.  Do recommend 3 in 1 and RW for improved transfers and gait due to L hip pain.  Patient will benefit from skilled PT to increase their independence and safety with mobility (while adhering to their precautions) to allow discharge to the venue listed below.     Follow Up Recommendations Follow surgeon's recommendation for DC plan and follow-up therapies;Supervision for mobility/OOB    Equipment Recommendations  Rolling walker with 5" wheels;3in1 (PT)    Recommendations for Other Services       Precautions / Restrictions Precautions Precautions: Back Precaution Booklet Issued: Yes (comment) Precaution Comments: Pt recalled precautions Required Braces or Orthoses: Spinal Brace Spinal Brace: Thoracolumbosacral orthotic;Applied in sitting position Restrictions Weight Bearing Restrictions: No      Mobility  Bed Mobility Overal bed mobility: Needs Assistance Bed Mobility: Rolling;Sit to Sidelying Rolling: Supervision       Sit to sidelying: Min  guard General bed mobility comments: at EOB upon arrival    Transfers Overall transfer level: Needs assistance Equipment used: Rolling walker (2 wheeled) Transfers: Sit to/from Stand Sit to Stand: Min guard         General transfer comment: increased time to determine method of least pain (worked best w/ L hand on RW and pushing with R hand from bed) then increased time to rise  Ambulation/Gait Ambulation/Gait assistance: Scientist, clinical (histocompatibility and immunogenetics) (Feet): 100 Feet Assistive device: Rolling walker (2 wheeled) Gait Pattern/deviations: Decreased stride length;Step-through pattern Gait velocity: decreased   General Gait Details: Min guard progressed to supervision.  Pt with slow but steady gait.  Educated on step to L gait if painful to step through.  Stairs Stairs: Yes Stairs assistance: Min guard Stair Management: Step to pattern;Forwards;One rail Right Number of Stairs: 9 General stair comments: 9 steps with rail on R up and L down, stepping up w/ R leg and down w/ L leg; min g for safety; going up pt placed both hands on rail but down with just L hand  Wheelchair Mobility    Modified Rankin (Stroke Patients Only)       Balance Overall balance assessment: Needs assistance Sitting-balance support: No upper extremity supported Sitting balance-Leahy Scale: Fair Sitting balance - Comments: did not challenge due to pain     Standing balance-Leahy Scale: Fair Standing balance comment: Used RW for pain control but could static stand without UE support  Pertinent Vitals/Pain Pain Assessment: 0-10 Pain Score: 6  Pain Location: R hip/buttock to posterior mid thigh Pain Descriptors / Indicators: Grimacing;Stabbing;Sharp;Burning;Guarding Pain Intervention(s): Limited activity within patient's tolerance;Monitored during session;Premedicated before session;Repositioned;Relaxation;Ice applied (education on positioning for pain  relief)    Home Living Family/patient expects to be discharged to:: Private residence Living Arrangements: Spouse/significant other;Children Available Help at Discharge: Family;Available 24 hours/day Type of Home: House Home Access: Stairs to enter Entrance Stairs-Rails: Right;Left (cannot reach both) Entrance Stairs-Number of Steps: 7 Home Layout: Multi-level;Other (Comment);Bed/bath upstairs (split level) Home Equipment: None      Prior Function Level of Independence: Independent         Comments: was getting PT for her back injury     Hand Dominance        Extremity/Trunk Assessment   Upper Extremity Assessment Upper Extremity Assessment: Defer to OT evaluation    Lower Extremity Assessment Lower Extremity Assessment: RLE deficits/detail;LLE deficits/detail RLE Deficits / Details: Overall WFL but not MMT due to acutity of sx RLE Sensation: WNL LLE Deficits / Details: Painful ; ROM overall WFL; MMT hip 2/5, ankle and knee 3/5 but not further tested due to acuity of sx and pain LLE Sensation: WNL    Cervical / Trunk Assessment Cervical / Trunk Assessment: Normal  Communication      Cognition Arousal/Alertness: Awake/alert Behavior During Therapy: WFL for tasks assessed/performed Overall Cognitive Status: Within Functional Limits for tasks assessed                                        General Comments General comments (skin integrity, edema, etc.):  Educated on back precautions, don/doff brace, safe car transfers, positioning for pain control (R sidelying pillow between knees or supine pillows under knees, or sleeping in recliner).  Spouse asking about pt's hip pain. Pt was aware she had CT this morning that was normal with surgical changes. Discussed would be best to discuss with surgeon but may be due to edema and surgical pain and is important to continue to monitor pain and follow up with MD.    Exercises     Assessment/Plan    PT  Assessment Patient needs continued PT services  PT Problem List Decreased strength;Decreased mobility;Decreased range of motion;Decreased knowledge of precautions;Decreased activity tolerance;Decreased balance;Decreased knowledge of use of DME;Pain       PT Treatment Interventions DME instruction;Therapeutic activities;Gait training;Therapeutic exercise;Patient/family education;Stair training;Balance training;Functional mobility training;Modalities    PT Goals (Current goals can be found in the Care Plan section)  Acute Rehab PT Goals Patient Stated Goal: decrease R hip/buttock pain PT Goal Formulation: With patient/family Time For Goal Achievement: 06/13/20 Potential to Achieve Goals: Good    Frequency Min 5X/week   Barriers to discharge        Co-evaluation               AM-PAC PT "6 Clicks" Mobility  Outcome Measure Help needed turning from your back to your side while in a flat bed without using bedrails?: None Help needed moving from lying on your back to sitting on the side of a flat bed without using bedrails?: A Little Help needed moving to and from a bed to a chair (including a wheelchair)?: A Little Help needed standing up from a chair using your arms (e.g., wheelchair or bedside chair)?: A Little Help needed to walk in hospital room?: A Little Help needed climbing  3-5 steps with a railing? : A Little 6 Click Score: 19    End of Session Equipment Utilized During Treatment: Gait belt;Back brace Activity Tolerance: Patient tolerated treatment well Patient left: in bed;with call bell/phone within reach;with family/visitor present Nurse Communication: Mobility status PT Visit Diagnosis: Other abnormalities of gait and mobility (R26.89);Pain Pain - Right/Left: Right Pain - part of body: Hip    Time: 1031-1101 PT Time Calculation (min) (ACUTE ONLY): 30 min   Charges:   PT Evaluation $PT Eval Moderate Complexity: 1 Mod PT Treatments $Gait Training: 8-22  mins        Anise Salvo, PT Acute Rehab Services Pager [phone] [facility] [phone]    Heather Short 05/30/2020, 11:36 AM

## 2020-05-30 NOTE — Progress Notes (Addendum)
  Progress Note    05/30/2020 7:26 AM 1 Day Post-Op  Subjective:  Has been OOB, voiding spontaneously, tolerating diet. Nausea resolved. +flatus   Vitals:   05/29/20 2317 05/30/20 0408  BP: 117/62 (!) 101/56  Pulse: 98 92  Resp: 20 20  Temp: 98.5 F (36.9 C) 98.5 F (36.9 C)  SpO2: 93% 94%    Physical Exam: Cardiac:  RRR Lungs:  CTAB Incisions:  Dressing dry and intact Extremities:  Moves all well Abdomen:  Soft, ND, +BS  CBC    Component Value Date/Time   WBC 9.9 05/27/2020 0859   RBC 4.64 05/27/2020 0859   HGB 13.7 05/27/2020 0859   HCT 42.3 05/27/2020 0859   PLT 348 05/27/2020 0859   MCV 91.2 05/27/2020 0859   MCH 29.5 05/27/2020 0859   MCHC 32.4 05/27/2020 0859   RDW 12.5 05/27/2020 0859   LYMPHSABS 3.7 05/27/2020 0859   MONOABS 0.5 05/27/2020 0859   EOSABS 0.2 05/27/2020 0859   BASOSABS 0.1 05/27/2020 0859    BMET    Component Value Date/Time   NA 137 05/27/2020 0859   K 4.4 05/27/2020 0859   CL 107 05/27/2020 0859   CO2 22 05/27/2020 0859   GLUCOSE 94 05/27/2020 0859   BUN 12 05/27/2020 0859   CREATININE 0.84 05/27/2020 0859   CALCIUM 9.2 05/27/2020 0859   GFRNONAA >60 05/27/2020 0859     Intake/Output Summary (Last 24 hours) at 05/30/2020 0726 Last data filed at 05/29/2020 1732 Gross per 24 hour  Intake 3310 ml  Output 950 ml  Net 2360 ml    HOSPITAL MEDICATIONS Scheduled Meds: . amphetamine-dextroamphetamine  10 mg Oral Daily  . buPROPion  300 mg Oral q morning - 10a  . cyclobenzaprine  10 mg Oral QHS  . docusate sodium  100 mg Oral BID  . gabapentin  900 mg Oral QHS  . imipramine  30 mg Oral QHS  . pantoprazole  40 mg Oral Daily  . sodium chloride flush  3 mL Intravenous Q12H  . SUMAtriptan  50 mg Oral Once  . traZODone  100 mg Oral QHS   Continuous Infusions: . sodium chloride 250 mL (05/29/20 1709)  . 0.9 % NaCl with KCl 20 mEq / L    . methocarbamol (ROBAXIN) IV     PRN Meds:.acetaminophen **OR** acetaminophen, alum &  mag hydroxide-simeth, bisacodyl, hydrOXYzine, hydrOXYzine, menthol-cetylpyridinium **OR** phenol, methocarbamol **OR** methocarbamol (ROBAXIN) IV, morphine injection, ondansetron **OR** ondansetron (ZOFRAN) IV, oxyCODONE-acetaminophen, senna-docusate, sodium chloride flush, sodium phosphate, zolpidem  Assessment: POD 1 anterior exposure L5-S1. Stable post-op      Plan: -As per primary team    Wendi Maya, PA-C [facility] [phone] 05/30/2020  7:26 AM    I agree with the above.  I have seen and evaluated patient.  She is postop day #1 from a anterior L5-S1 exposure.  Her abdomen remains soft.  She has palpable pedal pulses and no swelling.  No acute vascular issues.  I will be available as needed.  [name]

## 2020-05-30 NOTE — Progress Notes (Signed)
I have reviewed patient's CAT scan, which appears pristine, with intact hardware which is appropriately positioned.  There is no explanation on the patient's CAT scan for the patient's left buttock pain.  I do feel that her pain is muscular.  I did discuss the results of the CAT scan with the patient and her husband as well.  Her pain has been improving.  She will be discharged home later today.  She did express that she is comfortable with this.

## 2020-06-03 MED FILL — Heparin Sodium (Porcine) Inj 1000 Unit/ML: INTRAMUSCULAR | Qty: 30 | Status: AC

## 2020-06-03 MED FILL — Sodium Chloride IV Soln 0.9%: INTRAVENOUS | Qty: 1000 | Status: AC

## 2020-06-05 NOTE — Discharge Summary (Signed)
Patient ID: Heather Short MRN: [id number] DOB/AGE: 09/12/1975 44 y.o.  Admit date: 05/29/2020 Discharge date: 05/30/2020  Admission Diagnoses:  Active Problems:   Radiculopathy   Discharge Diagnoses:  Same  Past Medical History:  Diagnosis Date  . Anxiety   . Complication of anesthesia   . GERD (gastroesophageal reflux disease)   . Headache    migranine last one Oct 12 ish  . Leg pain    left  . Low back pain   . PONV (postoperative nausea and vomiting)    severe vomiting  . PTSD (post-traumatic stress disorder)   . Spondylolisthesis at L5-S1 level   . SVT (supraventricular tachycardia) (HCC) 2008   during preganancy    Surgeries: Procedure(s): LUMBAR FIVE - SACRUM ONE ANTERIOR LUMBAR INTERBODY FUSION WITH INSTRUMENTATION AND ALLOGRAFT POSTERIOR SPINAL FUSION LUMBAR FIVE - SACRUM ONE WITH INSTRUMENTATION ABDOMINAL EXPOSURE on 05/29/2020   Consultants: Treatment Team:  Nada Libman, MD  Discharged Condition: Improved  Hospital Course: Heather Short is an 44 y.o. female who was admitted 05/29/2020 for operative treatment of radiculopathy. Patient has severe unremitting pain that affects sleep, daily activities, and work/hobbies. After pre-op clearance the patient was taken to the operating room on 05/29/2020 and underwent  Procedure(s): LUMBAR FIVE - SACRUM ONE ANTERIOR LUMBAR INTERBODY FUSION WITH INSTRUMENTATION AND ALLOGRAFT POSTERIOR SPINAL FUSION LUMBAR FIVE - SACRUM ONE WITH INSTRUMENTATION ABDOMINAL EXPOSURE.    Patient was given perioperative antibiotics:  Anti-infectives (From admission, onward)   Start     Dose/Rate Route Frequency Ordered Stop   05/29/20 1700  ceFAZolin (ANCEF) IVPB 2g/100 mL premix        2 g 200 mL/hr over 30 Minutes Intravenous Every 8 hours 05/29/20 1509 05/30/20 0910   05/29/20 0700  ceFAZolin (ANCEF) IVPB 2g/100 mL premix        2 g 200 mL/hr over 30 Minutes Intravenous On call to O.R. 05/29/20 0653 05/29/20 0900        Patient was given sequential compression devices, early ambulation to prevent DVT.  Patient benefited maximally from hospital stay and there were no complications.    Recent vital signs: BP (!) 98/55 (BP Location: Right Arm)   Pulse 75   Temp 98.4 F (36.9 C) (Oral)   Resp 18   Ht 5\' 2"  (1.575 m)   Wt 88.2 kg   SpO2 94%   BMI 35.57 kg/m   Discharge Medications:   Allergies as of 05/30/2020      Reactions   Eletriptan    Other reaction(s): Other Patient reports her throat swells when she takes relpax   Midazolam Hives   Syncope vomitting   Topiramate    Other reaction(s): Confusion   Latex Hives      Medication List    TAKE these medications   amphetamine-dextroamphetamine 10 MG tablet Commonly known as: ADDERALL Take 10 mg by mouth daily.   buPROPion 300 MG 24 hr tablet Commonly known as: WELLBUTRIN XL Take 300 mg by mouth every morning.   cyclobenzaprine 5 MG tablet Commonly known as: FLEXERIL Take 10 mg by mouth at bedtime.   frovatriptan 2.5 MG tablet Commonly known as: FROVA Take 205 mg by mouth daily as needed for migraine.   gabapentin 300 MG capsule Commonly known as: NEURONTIN Take 900 mg by mouth at bedtime.   hydrOXYzine 25 MG capsule Commonly known as: VISTARIL Take 25 mg by mouth every 6 (six) hours as needed (Migraine).   imipramine 10 MG tablet  Commonly known as: TOFRANIL Take 30 mg by mouth at bedtime.   methocarbamol 500 MG tablet Commonly known as: ROBAXIN Take 1 tablet (500 mg total) by mouth every 6 (six) hours as needed for muscle spasms.   omeprazole 40 MG capsule Commonly known as: PRILOSEC Take 40 mg by mouth daily.   ondansetron 4 MG tablet Commonly known as: ZOFRAN Take 4 mg by mouth daily as needed (Migraine).   oxyCODONE-acetaminophen 5-325 MG tablet Commonly known as: PERCOCET/ROXICET Take 1-2 tablets by mouth every 4 (four) hours as needed for moderate pain or severe pain.   tiZANidine 4 MG  capsule Commonly known as: ZANAFLEX Take 4 mg by mouth daily as needed (Back pain).   traZODone 100 MG tablet Commonly known as: DESYREL Take 100 mg by mouth at bedtime.       Diagnostic Studies: DG Lumbar Spine 2-3 Views  Result Date: 05/29/2020 CLINICAL DATA:  Surgery, elective. Additional history obtained from electronic MEDICAL RECORD NUMBERAnterior lumbar interbody fusion (L5-S1), insertion of interbody device, placement of anterior instrumentation securing the L5-S1 level, L5-S1 posterior spinal fusion. Reported fluorocopy time: 3 minutes, 9 seconds (186.67 mGy). EXAM: LUMBAR SPINE - 2-3 VIEW; DG C-ARM 1-60 MIN COMPARISON:  Radiographs of the lumbar spine performed earlier the same day 05/29/2020. FINDINGS: PA and lateral view intraoperative fluoroscopic images of the lumbar spine are submitted, two images total. The images demonstrate and L5-S1 posterior spinal fusion construct. L5-S1 interbody spacer. Additional spinal fusion hardware may be present anteriorly along the L5-S1 disc space. IMPRESSION: Two intraoperative fluoroscopic images of the lumbar spine as described. Electronically Signed   By: Jackey Loge DO   On: 05/29/2020 14:18   CT LUMBAR SPINE WO CONTRAST  Result Date: 05/30/2020 CLINICAL DATA:  Postop day 1 L5-S1 anterior and posterior fusion. Left buttock pain. EXAM: CT LUMBAR SPINE WITHOUT CONTRAST TECHNIQUE: Multidetector CT imaging of the lumbar spine was performed without intravenous contrast administration. Multiplanar CT image reconstructions were also generated. COMPARISON:  Lumbar spine radiographs 05/29/2020 FINDINGS: Segmentation: Normal. Alignment: Slight anterolisthesis L5-S1 otherwise normal alignment. Vertebrae: Negative for fracture or mass. Hardware: Bilateral pedicle screw and rod fusion L5-S1. Hardware in good position. No hardware failure. Interbody spacer L5-S1 in good position. Anterior screw in the S1 vertebral body in good position. There is a small amount of  gas in the subcutaneous tissues of the left buttock due to recent surgery. There is also a small amount of retroperitoneal gas anterior to the disc space at L5-S1 due to anterior fusion. Paraspinal and other soft tissues: Negative for fluid collection. No paraspinous mass or adenopathy. Mild atherosclerotic aorta. Disc levels: T12-L1: Negative L1-2: Negative L2-3: Negative L3-4: Negative L4-5: Negative L5-S1: Anterior and posterior fusion. Hardware in satisfactory position. No fracture identified. Bilateral pars defects of L5. These are presumably chronic. No significant stenosis. Neural foramina adequately patent bilaterally. IMPRESSION: Postop day 1 anterior and posterior fusion L5-S1. No fluid collection. Hardware in satisfactory position. Bilateral pars defects of L5 with mild anterolisthesis L5-S1. No significant spinal or foraminal stenosis. Electronically Signed   By: Marlan Palau M.D.   On: 05/30/2020 09:14   DG C-Arm 1-60 Min  Result Date: 05/29/2020 CLINICAL DATA:  Surgery, elective. Additional history obtained from electronic MEDICAL RECORD NUMBERAnterior lumbar interbody fusion (L5-S1), insertion of interbody device, placement of anterior instrumentation securing the L5-S1 level, L5-S1 posterior spinal fusion. Reported fluorocopy time: 3 minutes, 9 seconds (186.67 mGy). EXAM: LUMBAR SPINE - 2-3 VIEW; DG C-ARM 1-60 MIN COMPARISON:  Radiographs  of the lumbar spine performed earlier the same day 05/29/2020. FINDINGS: PA and lateral view intraoperative fluoroscopic images of the lumbar spine are submitted, two images total. The images demonstrate and L5-S1 posterior spinal fusion construct. L5-S1 interbody spacer. Additional spinal fusion hardware may be present anteriorly along the L5-S1 disc space. IMPRESSION: Two intraoperative fluoroscopic images of the lumbar spine as described. Electronically Signed   By: Jackey Loge DO   On: 05/29/2020 14:18   DG OR LOCAL ABDOMEN  Result Date:  05/29/2020 CLINICAL DATA:  Patient status post L5-S1 ALIF. Intraoperative film for foreign body. EXAM: OR LOCAL ABDOMEN COMPARISON:  None. FINDINGS: No unexpected foreign body is identified. Lower lumbar fusion hardware noted. IMPRESSION: Negative for unexpected foreign body. These results were called by telephone at the time of interpretation on 05/29/2020 at 11:04 am to provider MARK DUMONSKI. Electronically Signed   By: Drusilla Kanner M.D.   On: 05/29/2020 11:06    Disposition: Discharge disposition: 01-Home or Self Care        POD #1 s/p L5/S1 A/P fusion, doing well with improved but ongoing left buttock pain. This is likely muscular, CT of lumbar spine was benign  - up with PT/OT, encourage ambulation - Percocet for pain, Robaxin for muscle spasms -Scripts for pain sent to pharmacy electronically  -D/C instructions sheet printed and in chart -D/C today  -F/U in office 2 weeks   Signed: Eilene Ghazi Annalysa Mohammad 06/05/2020, 11:42 AM

## 2022-04-17 IMAGING — CT CT L SPINE W/O CM
3 series · 12 of 33 positions shown, 14 images · non-contrast
Comparison: Lumbar spine radiographs 05/29/2020

CLINICAL DATA: Postop day 1 L5-S1 anterior and posterior fusion.
Left buttock pain.

EXAM:
CT LUMBAR SPINE WITHOUT CONTRAST
TECHNIQUE: Multidetector CT imaging of the lumbar spine was performed without
intravenous contrast administration. Multiplanar CT image
reconstructions were also generated.

[Series 5: l spine 2.0 (person_name) (person_name) · axial · 0.26mm/px · z∈[-1282,-1102]mm · 4 of 131 slices shown, 5 images]
[im 21/131  soft-tissue]
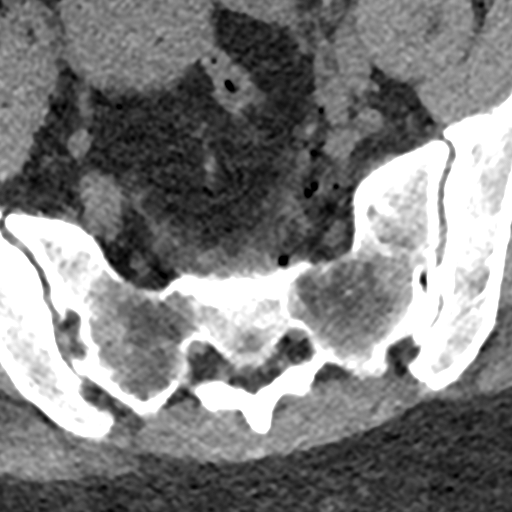
[im 21/131  bone]
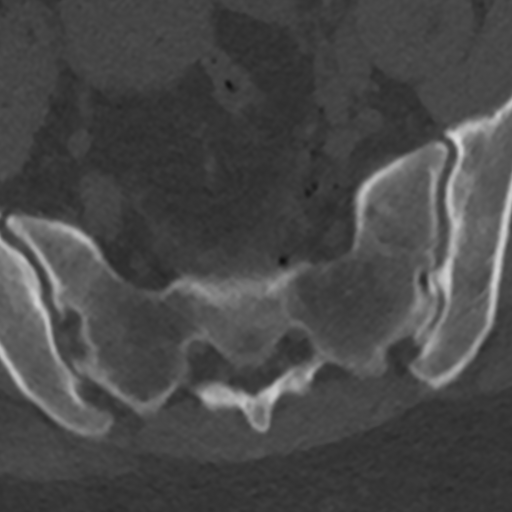
[im 51/131  bone]
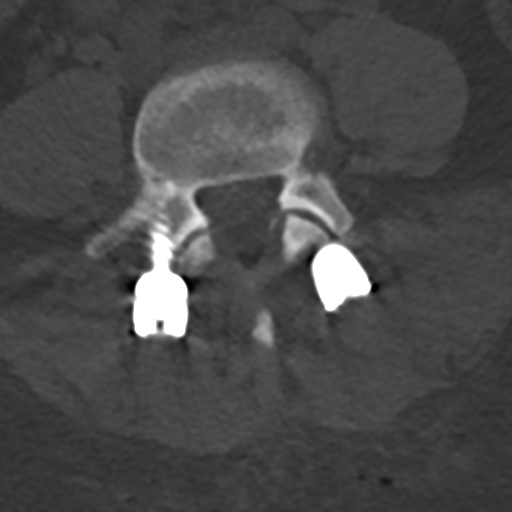
[im 81/131  bone]
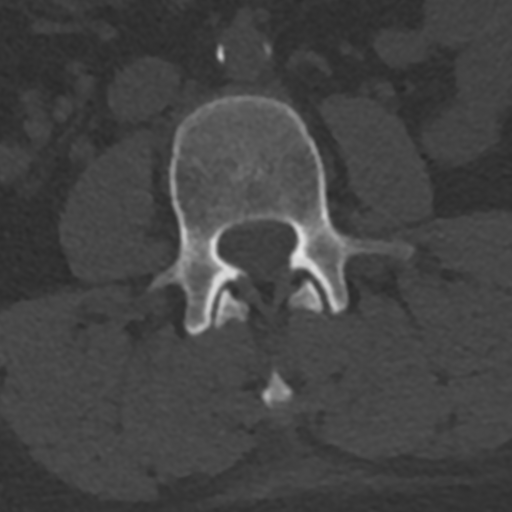
[im 111/131  bone]
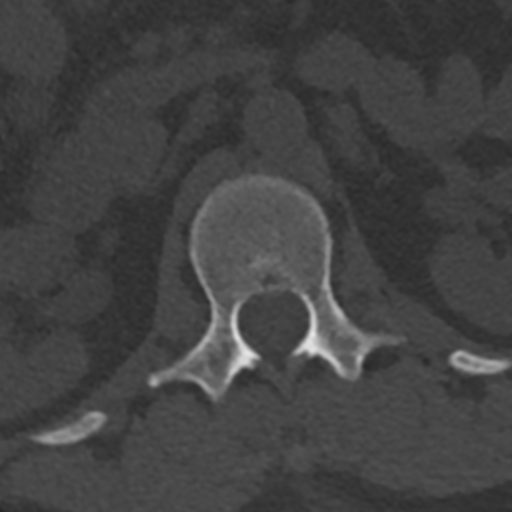

[Series 7: coronal st · coronal · 0.31mm/px · 3 of 73 slices shown]
[im 15/73  bone]
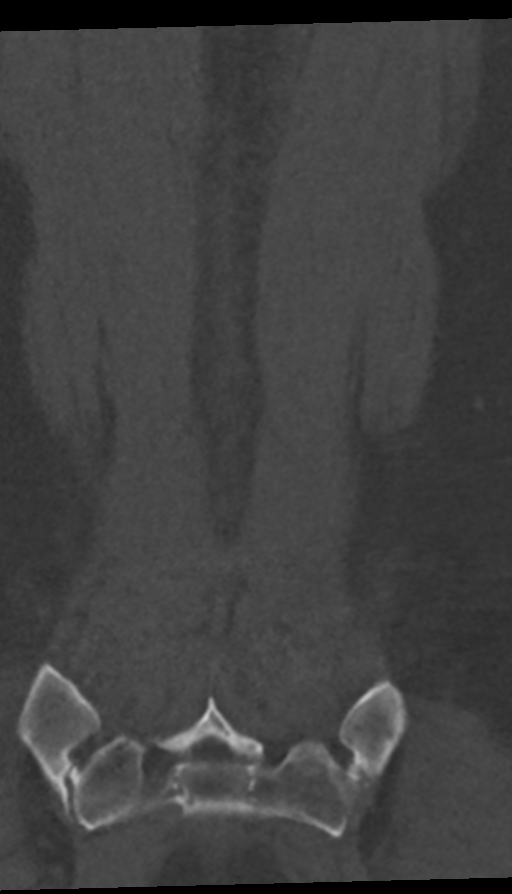
[im 29/73  bone]
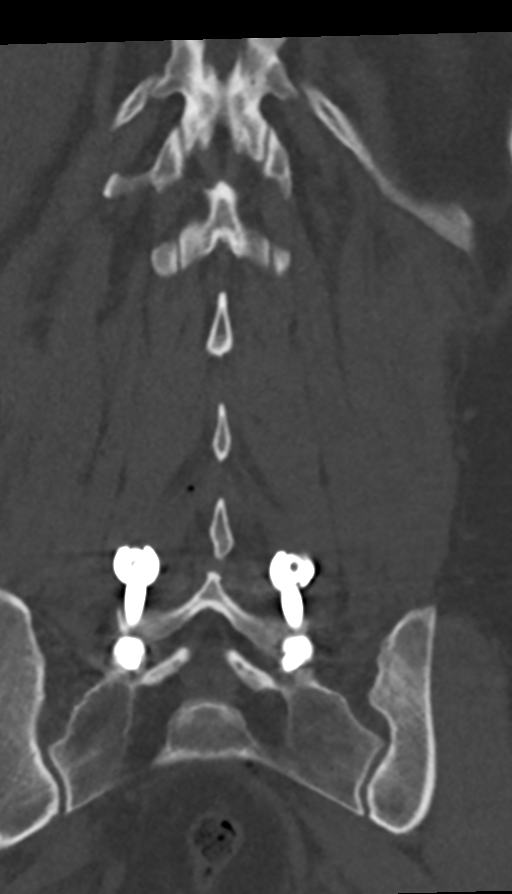
[im 44/73  bone]
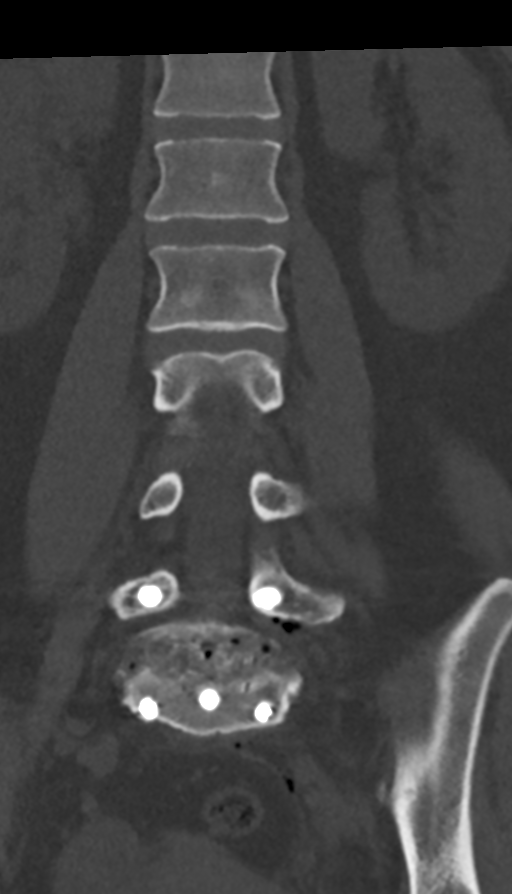

[Series 9: sagittal st · sagittal · 0.38mm/px · 5 of 61 slices shown, 6 images]
[im 21/61  bone]
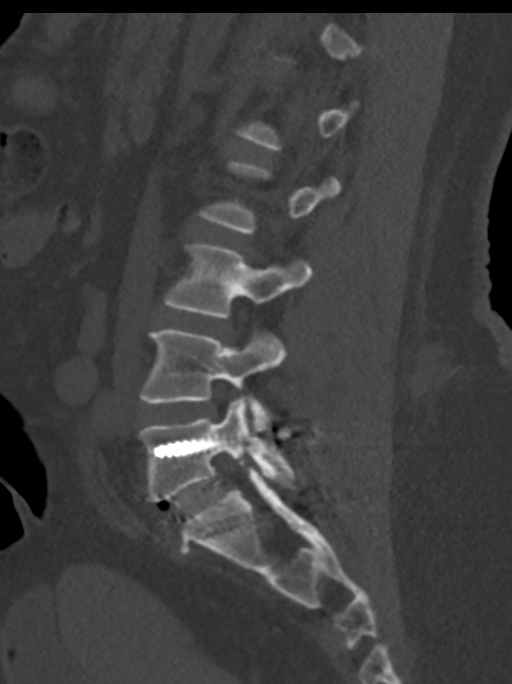
[im 26/61  bone]
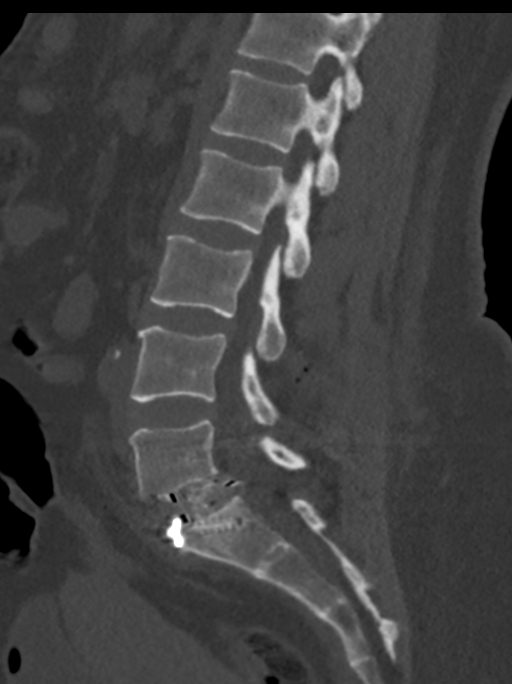
[im 31/61  soft-tissue]
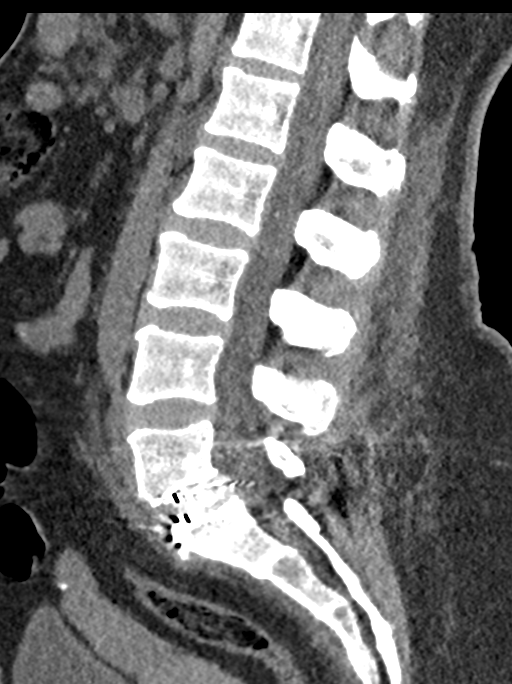
[im 31/61  bone]
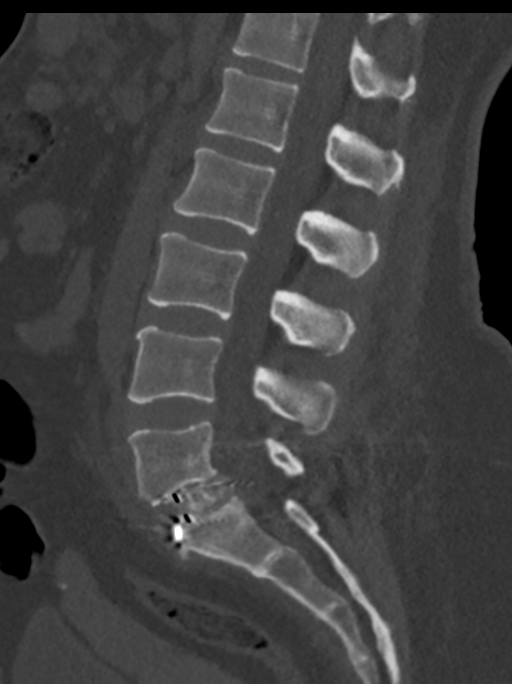
[im 36/61  bone]
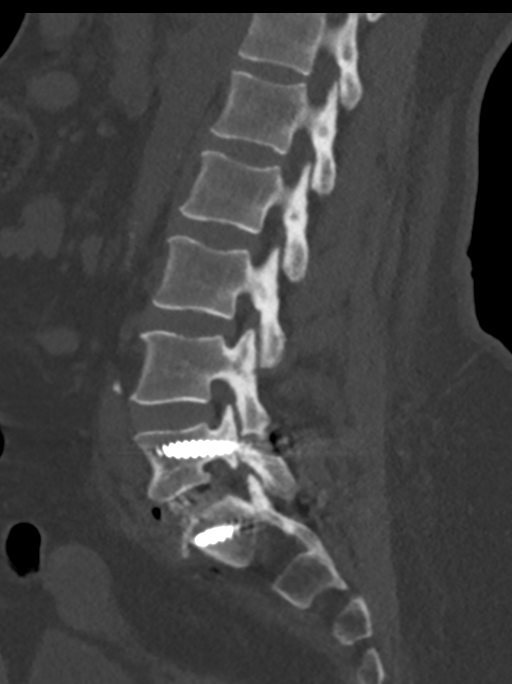
[im 41/61  bone]
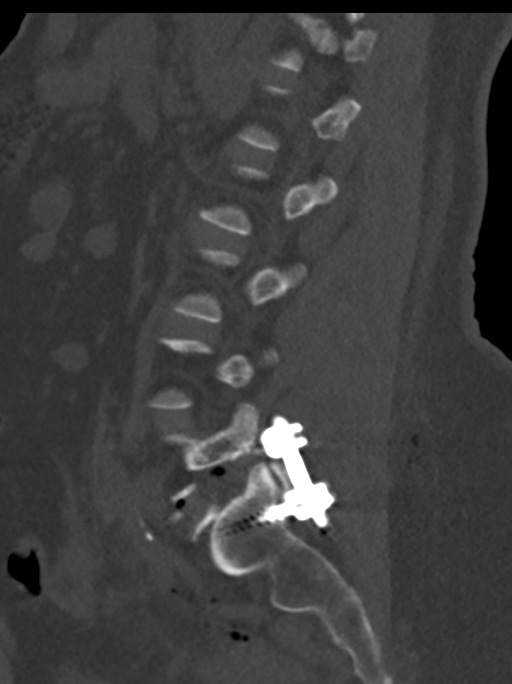

[12 of 33 positions shown; findings below may reference images not displayed]

FINDINGS: Segmentation: Normal.

Alignment: Slight anterolisthesis L5-S1 otherwise normal alignment.

Vertebrae: Negative for fracture or mass.

Hardware: Bilateral pedicle screw and rod fusion L5-S1. Hardware in
good position. No hardware failure. Interbody spacer L5-S1 in good
position. Anterior screw in the S1 vertebral body in good position.
There is a small amount of gas in the subcutaneous tissues of the
left buttock due to recent surgery. There is also a small amount of
retroperitoneal gas anterior to the disc space at L5-S1 due to
anterior fusion.

Paraspinal and other soft tissues: Negative for fluid collection. No
paraspinous mass or adenopathy. Mild atherosclerotic aorta.

Disc levels: T12-L1: Negative

L1-2: Negative

L2-3: Negative

L3-4: Negative

L4-5: Negative

L5-S1: Anterior and posterior fusion. Hardware in satisfactory
position. No fracture identified. Bilateral pars defects of L5.
These are presumably chronic. No significant stenosis. Neural
foramina adequately patent bilaterally.
IMPRESSION: Postop day 1 anterior and posterior fusion L5-S1. No fluid
collection. Hardware in satisfactory position. Bilateral pars
defects of L5 with mild anterolisthesis L5-S1. No significant spinal
or foraminal stenosis.

## 2023-06-19 LAB — COLOGUARD: COLOGUARD: NEGATIVE

## 2024-03-16 ENCOUNTER — Telehealth: Payer: Self-pay

## 2024-03-16 NOTE — Telephone Encounter (Signed)
 Patient is NOT a patient at our clinic.
